# Patient Record
Sex: Female | Born: 1935
Health system: Southern US, Community
[De-identification: ages and names within clinical notes are randomized; demographics above are authoritative.]

## PROBLEM LIST (undated history)

## (undated) DIAGNOSIS — K5730 Diverticulosis of large intestine without perforation or abscess without bleeding: Secondary | ICD-10-CM

## (undated) DIAGNOSIS — K269 Duodenal ulcer, unspecified as acute or chronic, without hemorrhage or perforation: Secondary | ICD-10-CM

## (undated) DIAGNOSIS — G459 Transient cerebral ischemic attack, unspecified: Secondary | ICD-10-CM

## (undated) DIAGNOSIS — E785 Hyperlipidemia, unspecified: Secondary | ICD-10-CM

## (undated) DIAGNOSIS — D126 Benign neoplasm of colon, unspecified: Secondary | ICD-10-CM

## (undated) DIAGNOSIS — K2950 Unspecified chronic gastritis without bleeding: Secondary | ICD-10-CM

## (undated) DIAGNOSIS — K859 Acute pancreatitis without necrosis or infection, unspecified: Secondary | ICD-10-CM

## (undated) DIAGNOSIS — K2970 Gastritis, unspecified, without bleeding: Secondary | ICD-10-CM

## (undated) DIAGNOSIS — K222 Esophageal obstruction: Secondary | ICD-10-CM

## (undated) DIAGNOSIS — I10 Essential (primary) hypertension: Secondary | ICD-10-CM

## (undated) DIAGNOSIS — K2270 Barrett's esophagus without dysplasia: Secondary | ICD-10-CM

## (undated) DIAGNOSIS — I341 Nonrheumatic mitral (valve) prolapse: Secondary | ICD-10-CM

## (undated) DIAGNOSIS — K635 Polyp of colon: Secondary | ICD-10-CM

## (undated) DIAGNOSIS — M1990 Unspecified osteoarthritis, unspecified site: Secondary | ICD-10-CM

## (undated) DIAGNOSIS — K219 Gastro-esophageal reflux disease without esophagitis: Secondary | ICD-10-CM

## (undated) DIAGNOSIS — IMO0002 Reserved for concepts with insufficient information to code with codable children: Secondary | ICD-10-CM

## (undated) HISTORY — DX: Duodenal ulcer, unspecified as acute or chronic, without hemorrhage or perforation: K26.9

## (undated) HISTORY — PX: BREAST SURGERY: SHX581

## (undated) HISTORY — PX: NECK SURGERY: SHX720

## (undated) HISTORY — DX: Unspecified osteoarthritis, unspecified site: M19.90

## (undated) HISTORY — PX: BACK SURGERY: SHX140

## (undated) HISTORY — DX: Acute pancreatitis without necrosis or infection, unspecified: K85.90

## (undated) HISTORY — DX: Barrett's esophagus without dysplasia: K22.70

## (undated) HISTORY — DX: Gastritis, unspecified, without bleeding: K29.70

## (undated) HISTORY — DX: Hyperlipidemia, unspecified: E78.5

## (undated) HISTORY — PX: KNEE ARTHROSCOPY: SUR90

## (undated) HISTORY — PX: CATARACT EXTRACTION: SUR2

## (undated) HISTORY — DX: Diverticulosis of large intestine without perforation or abscess without bleeding: K57.30

## (undated) HISTORY — PX: ABDOMINAL HYSTERECTOMY: SUR658

## (undated) HISTORY — PX: EYE SURGERY: SHX253

## (undated) HISTORY — PX: CHOLECYSTECTOMY: SHX55

## (undated) HISTORY — DX: Polyp of colon: K63.5

## (undated) HISTORY — DX: Transient cerebral ischemic attack, unspecified: G45.9

## (undated) HISTORY — DX: Nonrheumatic mitral (valve) prolapse: I34.1

## (undated) HISTORY — DX: Benign neoplasm of colon, unspecified: D12.6

## (undated) HISTORY — DX: Esophageal obstruction: K22.2

## (undated) HISTORY — PX: TONSILLECTOMY: SUR1361

## (undated) HISTORY — DX: Essential (primary) hypertension: I10

## (undated) HISTORY — DX: Reserved for concepts with insufficient information to code with codable children: IMO0002

## (undated) HISTORY — DX: Unspecified chronic gastritis without bleeding: K29.50

## (undated) HISTORY — DX: Gastro-esophageal reflux disease without esophagitis: K21.9

## (undated) HISTORY — PX: APPENDECTOMY: SHX54

## (undated) HISTORY — PX: FOOT SURGERY: SHX648

---

## 1984-06-25 ENCOUNTER — Encounter (INDEPENDENT_AMBULATORY_CARE_PROVIDER_SITE_OTHER): Payer: Self-pay | Admitting: *Deleted

## 1989-02-26 ENCOUNTER — Encounter (INDEPENDENT_AMBULATORY_CARE_PROVIDER_SITE_OTHER): Payer: Self-pay | Admitting: *Deleted

## 1998-05-03 ENCOUNTER — Encounter: Payer: Self-pay | Admitting: Internal Medicine

## 1998-06-05 ENCOUNTER — Encounter: Payer: Self-pay | Admitting: Internal Medicine

## 2003-05-04 ENCOUNTER — Encounter: Payer: Self-pay | Admitting: Internal Medicine

## 2003-05-18 ENCOUNTER — Ambulatory Visit (HOSPITAL_COMMUNITY): Admission: RE | Admit: 2003-05-18 | Discharge: 2003-05-18 | Payer: Self-pay | Admitting: Internal Medicine

## 2003-05-18 ENCOUNTER — Encounter (INDEPENDENT_AMBULATORY_CARE_PROVIDER_SITE_OTHER): Payer: Self-pay | Admitting: *Deleted

## 2006-11-22 ENCOUNTER — Encounter: Payer: Self-pay | Admitting: Internal Medicine

## 2009-06-05 ENCOUNTER — Encounter (INDEPENDENT_AMBULATORY_CARE_PROVIDER_SITE_OTHER): Payer: Self-pay | Admitting: *Deleted

## 2009-07-15 ENCOUNTER — Ambulatory Visit: Payer: Self-pay | Admitting: Internal Medicine

## 2009-07-15 DIAGNOSIS — K5900 Constipation, unspecified: Secondary | ICD-10-CM

## 2009-07-15 DIAGNOSIS — R1013 Epigastric pain: Secondary | ICD-10-CM

## 2009-07-15 DIAGNOSIS — K219 Gastro-esophageal reflux disease without esophagitis: Secondary | ICD-10-CM | POA: Insufficient documentation

## 2009-07-15 DIAGNOSIS — R1011 Right upper quadrant pain: Secondary | ICD-10-CM

## 2009-07-15 HISTORY — DX: Constipation, unspecified: K59.00

## 2009-07-15 HISTORY — DX: Right upper quadrant pain: R10.11

## 2009-07-16 ENCOUNTER — Ambulatory Visit: Payer: Self-pay | Admitting: Internal Medicine

## 2009-07-16 LAB — CONVERTED CEMR LAB: UREASE: NEGATIVE

## 2009-07-18 ENCOUNTER — Ambulatory Visit (HOSPITAL_COMMUNITY): Admission: RE | Admit: 2009-07-18 | Discharge: 2009-07-18 | Payer: Self-pay | Admitting: Internal Medicine

## 2009-08-30 ENCOUNTER — Telehealth (INDEPENDENT_AMBULATORY_CARE_PROVIDER_SITE_OTHER): Payer: Self-pay | Admitting: *Deleted

## 2010-07-29 NOTE — Letter (Signed)
Summary: EGD Instructions  [facility]  [address] [place], [place] [address]   Phone: [phone]  Fax: [phone]       Pamela Hogan    01/21/1935    MRN: [id number]       Procedure Day /Date:TUESDAY, 07/16/09     Arrival Time: 1:30 PM     Procedure Time:2:30 PM     Location of Procedure:                    [facility] (4th Floor)    PREPARATION FOR ENDOSCOPY   OnTUESDAYTHE DAY OF THE PROCEDURE:  1.   No solid foods, milk or milk products are allowed after midnight the night before your procedure.  2.   Do not drink anything colored red or purple.  Avoid juices with pulp.  No orange juice.  3.  You may drink clear liquids until 12:30 PM, which is 2 hours before your procedure.                                                                                                CLEAR LIQUIDS INCLUDE: Water Jello Ice Popsicles Tea (sugar ok, no milk/cream) Powdered fruit flavored drinks Coffee (sugar ok, no milk/cream) Gatorade Juice: apple, white grape, white cranberry  Lemonade Clear bullion, consomm, broth Carbonated beverages (any kind) Strained chicken noodle soup Hard Candy   MEDICATION INSTRUCTIONS  Unless otherwise instructed, you should take regular prescription medications with a small sip of water as early as possible the morning of your procedure.             OTHER INSTRUCTIONS  You will need a responsible adult at least 74 years of age to accompany you and drive you home.   This person must remain in the waiting room during your procedure.  Wear loose fitting clothing that is easily removed.  Leave jewelry and other valuables at home.  However, you may wish to bring a book to read or an iPod/MP3 player to listen to music as you wait for your procedure to start.  Remove all body piercing jewelry and leave at home.  Total time from sign-in until discharge is approximately 2-3 hours.  You should go home  directly after your procedure and rest.  You can resume normal activities the day after your procedure.  The day of your procedure you should not:   Drive   Make legal decisions   Operate machinery   Drink alcohol   Return to work  You will receive specific instructions about eating, activities and medications before you leave.    The above instructions have been reviewed and explained to me by   _______________________    I fully understand and can verbalize these instructions _____________________________ Date _________

## 2010-07-29 NOTE — Procedures (Signed)
Summary: Colonoscopy w/ Bx/[facility]  Colonoscopy w/ Bx/[facility]   Imported By: Sherian Rein 07/17/2009 07:56:58  _____________________________________________________________________  External Attachment:    Type:   Image     Comment:   External Document

## 2010-07-29 NOTE — Procedures (Signed)
Summary: Upper Endoscopy  Patient: Pamela Hogan Note: All result statuses are Final unless otherwise noted.  Tests: (1) Upper Endoscopy (EGD)   EGD Upper Endoscopy       DONE     [facility]     [address]     [place], [place]  [address]           ENDOSCOPY PROCEDURE REPORT           PATIENT:  Pamela, Hogan  MR#:  [id number]     BIRTHDATE:  1934-08-15, 74 yrs. old  GENDER:  female           ENDOSCOPIST:  Wilhemina Bonito. Eda Keys, MD     Referred by:  Office           PROCEDURE DATE:  07/16/2009     PROCEDURE:  EGD with biopsy     ASA CLASS:  Class II     INDICATIONS:  abdominal pain, right upper quad           MEDICATIONS:   Fentanyl 50 mcg IV, Versed 5 mg IV     TOPICAL ANESTHETIC:  Exactacain Spray           DESCRIPTION OF PROCEDURE:   After the risks benefits and     alternatives of the procedure were thoroughly explained, informed     consent was obtained.  The LB GIF-H180 [id number] endoscope was     introduced through the mouth and advanced to the second portion of     the duodenum, without limitations.  The instrument was slowly     withdrawn as the mucosa was fully examined.     <<PROCEDUREIMAGES>>           A benign ring-like stricture was found in the distal esophagus.  A     3cm hiatal hernia was present.  Multiple erosions were found in     the antrum. CLO bx taken. Otherwise the examination was normal to     D2.    Retroflexed views revealed the hiatal hernia.    The scope     was then withdrawn from the patient and the procedure completed.           COMPLICATIONS:  None           ENDOSCOPIC IMPRESSION:     1) Stricture in the distal esophagus (asymptomatic)     2) Hiatal hernia     3) Erosions, multiple in the antrum - s/p CLO biopsy     4) Otherwise normal examination     5) GERD           RECOMMENDATIONS:     1) continue Nexium     2) await biopsy results     3) Keep Abdominal Ultrasound appointment as planned        ______________________________     Wilhemina Bonito. Eda Keys, MD           CC:  Nadine Counts, MD, The Patient           n.     eSIGNED:   Wilhemina Bonito. Eda Keys at 07/16/2009 03:02 PM           Marya Fossa, [id number]  Note: An exclamation mark (!) indicates a result that was not dispersed into the flowsheet. Document Creation Date: 07/16/2009 3:02 PM _______________________________________________________________________  (1) Order result status: Final Collection or observation date-time: 07/16/2009 14:55 Requested date-time:  Receipt date-time:  Reported date-time:  Referring Physician:   Ordering Physician: Fransico Setters [phone]) Specimen Source:  Source: Launa Grill Order Number: [id number] Lab site:

## 2010-07-29 NOTE — Procedures (Signed)
Summary: EGD   EGD  Procedure date:  05/04/2003  Findings:      Location: [facility]  Findings: Stricture:  GERD  EGD  Procedure date:  05/04/2003  Findings:      Location: [facility]  Findings: Stricture:  GERD Patient Name: Pamela Hogan, Pamela Hogan. MRN:  Procedure Procedures: Panendoscopy (EGD) CPT: 43235.  Personnel: Endoscopist: Wilhemina Bonito. Marina Goodell, MD.  Exam Location: Exam performed in Outpatient Clinic. Outpatient  Patient Consent: Procedure, Alternatives, Risks and Benefits discussed, consent obtained, from patient. Consent was obtained by the RN.  Indications Symptoms: Abdominal pain, location: RUQ.  History  Current Medications: Patient is not currently taking Coumadin.  Pre-Exam Physical: Performed May 04, 2003  Entire physical exam was normal.  Exam Exam Info: Maximum depth of insertion Duodenum, intended Duodenum. Patient position: on left side. Vocal cords visualized. Gastric retroflexion performed. Images taken. ASA Classification: II. Tolerance: excellent.  Sedation Meds: Patient assessed and found to be appropriate for moderate (conscious) sedation. Residual sedation present from prior procedure today.  Monitoring: BP and pulse monitoring done. Oximetry used. Supplemental O2 given  Findings STRICTURE / STENOSIS: Stricture in Distal Esophagus.  Constriction: partial. Etiology: benign due to reflux. 31 cm from mouth. Lumen diameter is 15 mm. ICD9: Esophageal Stricture: 530.3.  HIATAL HERNIA:    Comments: Otherwise normal Assessment Abnormal examination, see findings above.  Diagnoses: 530.3: Esophageal Stricture.  530.81: GERD.   Events  Unplanned Intervention: No unplanned interventions were required.  Unplanned Events: There were no complications. Plans Medication(s): Continue current medications.  Disposition: After procedure patient sent to recovery. After recovery patient sent home.  Scheduling: Office  Visit, to [employer]. Marina Goodell, MD, in 4-6 weeks    This report was created from the original endoscopy report, which was reviewed and signed by the above listed endoscopist.   cc:  Nadine Counts, MD      The Patient

## 2010-07-29 NOTE — Progress Notes (Signed)
  Phone Note Call from Patient Call back at Home Phone [phone]   Caller: Mom Reason for Call: Acute Illness Details for Reason: COUGH Initial call taken by: Lannette Donath,  August 30, 2009 12:45 PM  Follow-up for Phone Call        needs Appt Scheduled Today Follow-up by: Lannette Donath,  August 30, 2009 12:49 PM  Additional Follow-up for Phone Call Additional follow up Details #1::        Appt Scheduled Today Additional Follow-up by: Lannette Donath,  August 30, 2009 12:50 PM

## 2010-07-29 NOTE — Procedures (Signed)
Summary: [facility]   Imported By: Sherian Rein 07/17/2009 10:05:55  _____________________________________________________________________  External Attachment:    Type:   Image     Comment:   External Document

## 2010-07-29 NOTE — Miscellaneous (Signed)
Summary: [facility]  Clinical Lists Changes  Orders: Added new Test order of TLB-H Pylori Screen Gastric Biopsy (83013-CLOTEST) - Signed 

## 2010-07-29 NOTE — Procedures (Signed)
Summary: Colonoscopy   Colonoscopy  Procedure date:  05/04/2003  Findings:      Location:  [facility].  Results: Polyp.  Small Hamartomattous   Patient Name: Pamela Hogan, Pamela Hogan. MRN:  Procedure Procedures: Colonoscopy CPT: [phone].    with polypectomy. CPT: [id number].  Personnel: Endoscopist: Wilhemina Bonito. Marina Goodell, MD.  Exam Location: Exam performed in Outpatient Clinic. Outpatient  Patient Consent: Procedure, Alternatives, Risks and Benefits discussed, consent obtained, from patient. Consent was obtained by the RN.  Indications  Surveillance of: Adenomatous Polyp(s). This is not an initial surveillance exam. Initial polypectomy was performed in 1985. Previous surveillance exam(s) in  1999,  History  Current Medications: Patient is not currently taking Coumadin.  Pre-Exam Physical: Performed May 04, 2003. Entire physical exam was normal.  Exam Exam: Extent of exam reached: Transverse Colon, extent intended: Cecum.  Exam incomplete due to fixed tortuous colon. Patient position: from side to side. Colon retroflexion performed. Images taken. ASA Classification: II. Tolerance: excellent.  Monitoring: Pulse and BP monitoring, Oximetry used. Supplemental O2 given.  Colon Prep Used Golytely for colon prep. Prep results: excellent.  Sedation Meds: Patient assessed and found to be appropriate for moderate (conscious) sedation. Fentanyl 100 mcg. given IV. Versed 8 mg. given IV.  Findings DIVERTICULOSIS: Sigmoid Colon. ICD9: Diverticulosis, Colon: 562.10.  POLYP: Sigmoid Colon, diminutive, sessile polyp. Procedure:  snare without cautery, removed, retrieved, Polyp sent to pathology. ICD9: Colon Polyps: 211.3.    Comments: Could not advance either adult or pediatric colonoscope beyond distal transverse colon Assessment Abnormal examination, see findings above.  Diagnoses: 562.10: Diverticulosis, Colon.  211.3: Colon Polyps.   Events  Unplanned Interventions: No  intervention was required.  Unplanned Events: There were no complications. Plans Disposition: After procedure patient sent to recovery. After recovery patient sent home.  Comments: ACBE "clear proximal colon"  This report was created from the original endoscopy report, which was reviewed and signed by the above listed endoscopist.   cc:  Nadine Counts, MD      The Patient

## 2010-07-29 NOTE — Procedures (Signed)
Summary: Colonoscopy/[facility]  Colonoscopy/[facility]   Imported By: Sherian Rein 07/17/2009 10:01:55  _____________________________________________________________________  External Attachment:    Type:   Image     Comment:   External Document

## 2010-07-29 NOTE — Procedures (Signed)
Summary: EGD/[facility]  EGD/[facility]   Imported By: Sherian Rein 07/17/2009 10:06:56  _____________________________________________________________________  External Attachment:    Type:   Image     Comment:   External Document

## 2010-07-29 NOTE — Assessment & Plan Note (Signed)
Summary: ABDOMINAL PAIN/CHANGE IN BOWEL/SP   History of Present Illness Visit Type: Initial Visit Primary GI MD: Yancey Flemings MD Primary Provider: Nadine Counts, MD Chief Complaint: Epigastric pain, constipation alternating with diarrhea at times, not constant History of Present Illness:   76 year old white female with a history of hypertension, hyperlipidemia, transient ischemic attacks, arthritis, GERD, peptic stricture, and adenomatous colon polyps. She presents today for evaluation of right upper quadrant and epigastric pain. She states that she has had this for many years. Her last such attack was about one month ago when she made the appointment. She states that the discomfort feels like an aching. The discomfort he said the last for days or even a week. Occasionally exacerbated by meals, though not always. She does feel constipated with pain and will notice some improvement after taking a laxative and had a bowel movement. Some nausea but no vomiting. For her reflux disease she is on Nexium. No problems with breakthrough pyrosis or dysphagia. She has undergone multiple colonoscopies in the past. These have been difficult and/or incomplete. Colonoscopies were performed in 1985, 1999, 2004, and most recently at [facility] with Dr. Chales Abrahams May 2008. I have reviewed the report. The examination was difficult but complete to the cecum. No polyps. Random biopsies were unremarkable (reviewed). Bleeding was determined to be secondary to internal hemorrhoids.. Her chronic medical problems are stable.   GI Review of Systems    Reports abdominal pain, acid reflux, belching, and  nausea.     Location of  Abdominal pain: rectal quadrant and epigastric.    Denies bloating, chest pain, dysphagia with liquids, dysphagia with solids, heartburn, loss of appetite, vomiting, vomiting blood, weight loss, and  weight gain.      Reports constipation, hemorrhoids, and  irritable bowel syndrome.     Denies anal  fissure, black tarry stools, change in bowel habit, diarrhea, diverticulosis, fecal incontinence, heme positive stool, jaundice, light color stool, liver problems, rectal bleeding, and  rectal pain. Preventive Screening-Counseling & Management  Alcohol-Tobacco     Smoking Status: never      Drug Use:  no.      Current Medications (verified): 1)  Aspirin 325 Mg Tabs (Aspirin) .Marland Kitchen.. 1 Tablet By Mouth Once Daily 2)  Nexium 40 Mg Cpdr (Esomeprazole Magnesium) .Marland Kitchen.. 1 Capsule By Mouth Once Daily 3)  Toprol Xl 100 Mg Xr24h-Tab (Metoprolol Succinate) .Marland Kitchen.. 1 Tablet By Mouth Once Daily 4)  Celexa 40 Mg Tabs (Citalopram Hydrobromide) .... Take As Directed 5)  Alprazolam 0.25 Mg Tabs (Alprazolam) .Marland Kitchen.. 1 Tablet By Mouth Once Daily 6)  Lisinopril-Hydrochlorothiazide 20-25 Mg Tabs (Lisinopril-Hydrochlorothiazide) .... Once Daily 7)  Multivitamins  Tabs (Multiple Vitamin) .... Once Daily 8)  Mobic 15 Mg Tabs (Meloxicam) .... Once Daily 9)  Cyclobenzaprine Hcl 10 Mg Tabs (Cyclobenzaprine Hcl) .... Once Daily 10)  Flonase 50 Mcg/act Susp (Fluticasone Propionate) .... As Needed 11)  Gabapentin 300 Mg Caps (Gabapentin) .... Three Times A Day 12)  Dicyclomine Hcl 10 Mg Caps (Dicyclomine Hcl) .... Two Times A Day As Needed 13)  Tramadol Hcl 50 Mg Tabs (Tramadol Hcl) .... As Needed  Allergies (verified): No Known Drug Allergies  Past History:  Past Medical History: Reviewed history from 07/11/2009 and no changes required. GERD HX of Colon Polyps Diverticulosis Peptic Stricture Hemorrhoids Hypertension Arthritis  Past Surgical History: Hysterectomy Breast Surgery Appendectomy Knee Arthroscopy  Family History: Family History of Breast Cancer: Family History of Colon Cancer: Family History of Heart Disease: Brother Family History of Irritable  Bowel Syndrome:Mother  Social History: Occupation: Retired Patient has never smoked.  Alcohol Use - no Illicit Drug Use - no Smoking Status:   never Drug Use:  no  Review of Systems       sinus an allergy trouble, arthritis, back pain, fatigue, occasional fever, headaches, muscle pains and cramps, night sweats occasionally. All other systems reviewed and reported as negative.  Vital Signs:  Patient profile:   76 year old female Height:      65 inches Weight:      149 pounds BMI:     24.88 Pulse rate:   60 / minute Pulse rhythm:   regular BP sitting:   158 / 82  (left arm) Cuff size:   regular  Vitals Entered By: June McMurray CMA Duncan Dull) (July 15, 2009 10:18 AM)  Physical Exam  General:  Well developed, well nourished, no acute distress. Head:  Normocephalic and atraumatic. Eyes:  PERRLA, no icterus. Ears:  Normal auditory acuity. Nose:  No deformity, discharge,  or lesions. Mouth:  No deformity or lesions, Neck:  Supple; no masses or thyromegaly. Lungs:  Clear throughout to auscultation. Heart:  Regular rate and rhythm; no murmurs, rubs,  or bruits. Abdomen:  Soft, nontender and nondistended. No masses, hepatosplenomegaly or hernias noted. Normal bowel sounds. Msk:  Symmetrical with no gross deformities. Normal posture. Pulses:  Normal pulses noted. Extremities:  No clubbing, cyanosis, edema or deformities noted. Neurologic:  Alert and  oriented x4;  grossly normal neurologically. Skin:  Intact without significant lesions or rashes. Cervical Nodes:  No significant cervical adenopathy.no supraclavicular adenopathy Psych:  Alert and cooperative. Normal mood and affect.   Impression & Recommendations:  Problem # 1:  ABDOMINAL PAIN RIGHT UPPER QUADRANT (ICD-789.01) Itermittent chronic right upper quadrant and epigastric (789.06) pain. This seems to be related to constipation. She has not had normal imaging or upper endoscopy in some time. She is concerned that the problem is worse or different. I am not certain that this is the case. No bleeding or weight loss.  Plan: #1. Diagnostic upper endoscopy. The nature  of the procedure as well as risks, benefits, and alternatives have been reviewed. She understood and agreed to  proceed. #2. Abdominal ultrasound  Problem # 2:  CONSTIPATION (ICD-564.00) constipation predominant irritable bowel syndrome.I feel that this could explain her abdominal complaints. She seems to get relief of her discomfort, somewhat, after defecating. As such, MiraLax for laxative tablets p.r.n. recommended.  Problem # 3:  GERD (ICD-530.81) classic GERD symptoms controlled with Nexium. No recurrent dysphagia  Plan: #1. Continue Nexium #2. Reflux precautions #3. Esophageal dilation p.r.n. recurrent dysphagia  Other Orders: Ultrasound Abdomen (UAS) EGD (EGD)  Patient Instructions: 1)  EGD [facility] 07/16/09 2:30 pm 2)  Abdominal Ultrasound [facility] 07/16/09 11:00 am 3)  Upper Endoscopy brochure given.  4)  The medication list was reviewed and reconciled.  All changed / newly prescribed medications were explained.  A complete medication list was provided to the patient / caregiver.

## 2010-08-01 NOTE — Procedures (Signed)
Summary: Upper GI Endoscopy/[facility]  Upper GI Endoscopy/[facility]   Imported By: Sherian Rein 07/17/2009 10:04:21  _____________________________________________________________________  External Attachment:    Type:   Image     Comment:   External Document

## 2010-12-21 IMAGING — US US ABDOMEN COMPLETE
1 series · 5 of 5 positions shown · non-contrast
Comparison: None.

CLINICAL DATA: Right upper abdominal pain.  Previous hysterectomy.
Hypertension.

COMPLETE ABDOMINAL ULTRASOUND

[Series 1: us abdomen complete · 0.21mm/px · 5 of 5 slices shown]
[im 1/5]
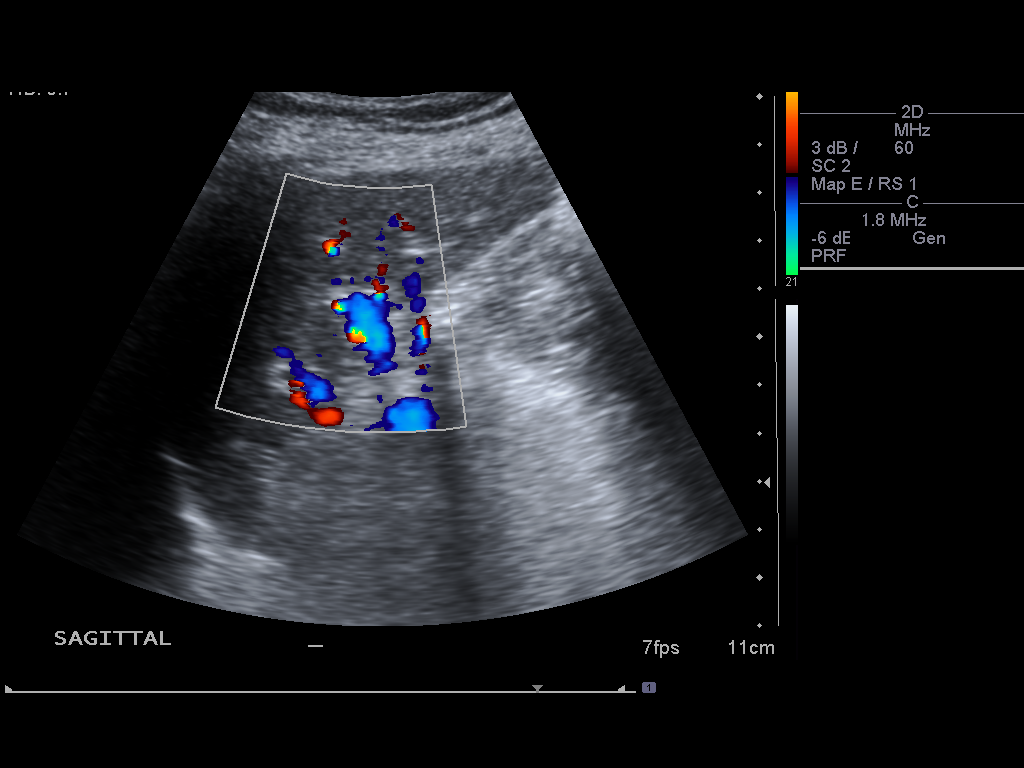
[im 2/5]
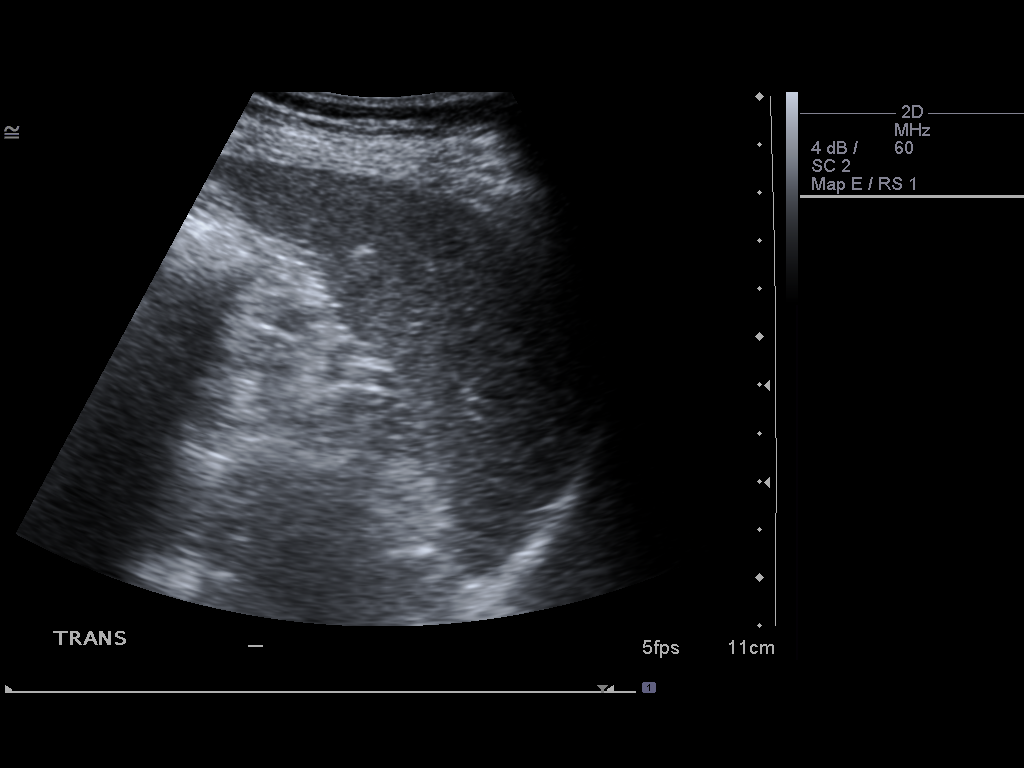
[im 3/5]
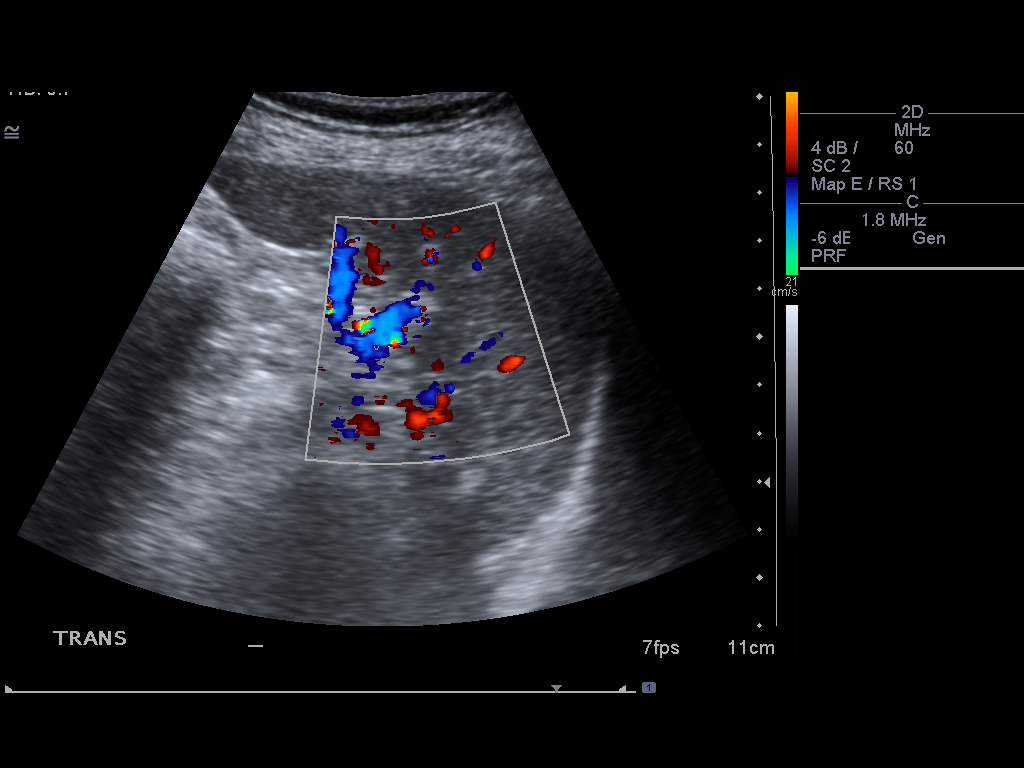
[im 4/5]
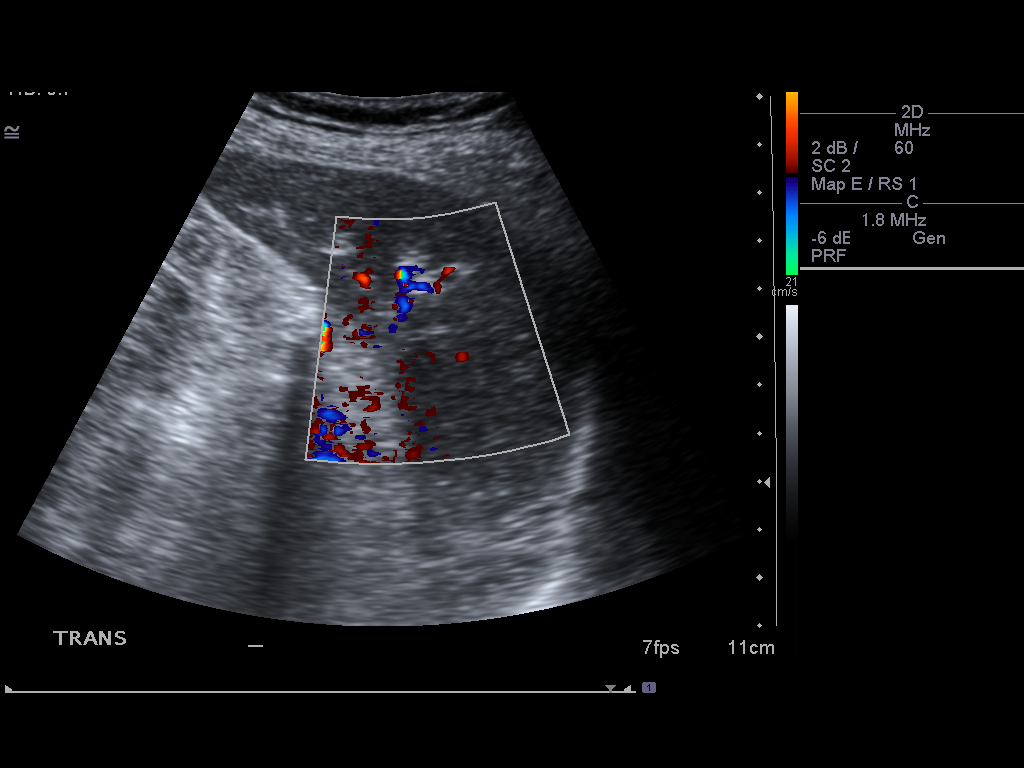
[im 5/5]
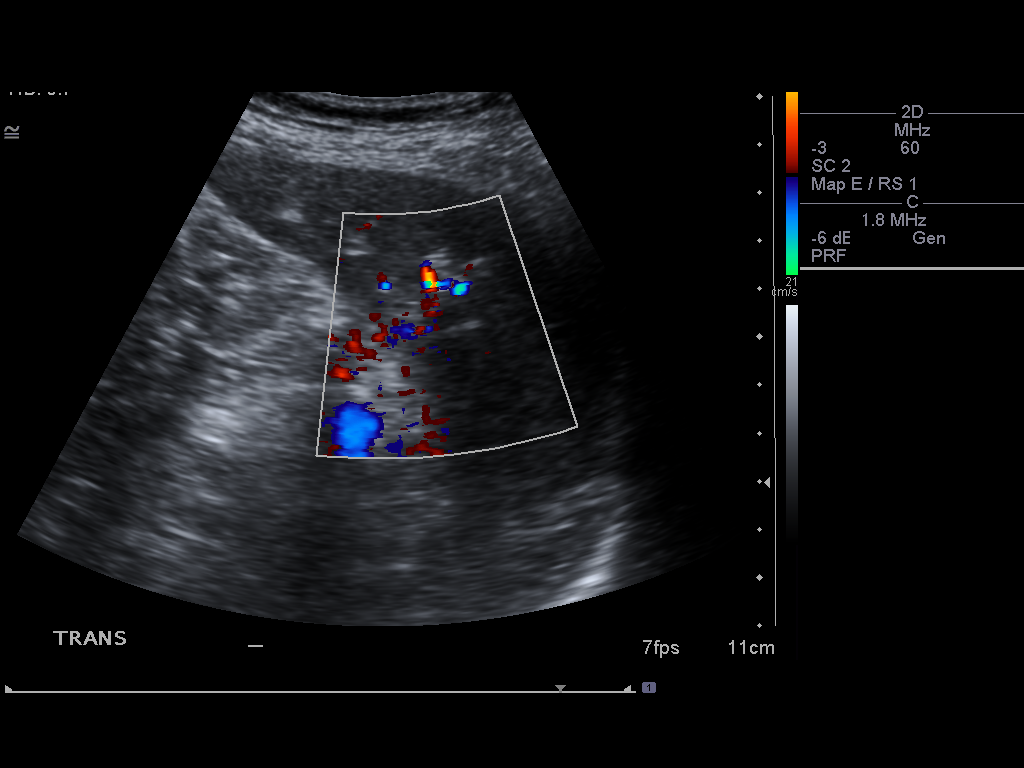

[5 of 5 positions shown; findings below may reference images not displayed]

FINDINGS: Gallbladder:  No gallstones, gallbladder wall thickening, or
pericholecystic fluid. Sonographer reports no sonographic Murphy's
sign.

Common bile duct:  3.1 mm diameter, unremarkable

Liver:  No focal lesion nor intrahepatic bile duct dilatation.
Normal caliber.

IVC:  Appears normal.

Pancreas:  Largely obscured by overlying bowel gas.

Spleen:  Unremarkable, 7.9 cm in length.

Right Kidney:  10.9 cm in length, no focal lesion or hydronephrosis

Left Kidney:  12 cm in length, without focal lesion or
hydronephrosis

Abdominal aorta:  No aneurysm identified.
IMPRESSION: 1.  No acute abnormality.
2.  Limited pancreatic visualization.

## 2011-02-12 ENCOUNTER — Telehealth: Payer: Self-pay | Admitting: Internal Medicine

## 2011-02-12 NOTE — Telephone Encounter (Signed)
Pt states that her stomach has been hurting and she has had some nausea, she has not actually thrown up. Pt reports that her stool has been dark black. Pt is requesting an appt. Pt scheduled to see Amy Esterwood PA 02/13/11@11am . Pt aware of appt date and time.

## 2011-02-13 ENCOUNTER — Other Ambulatory Visit (INDEPENDENT_AMBULATORY_CARE_PROVIDER_SITE_OTHER): Payer: MEDICARE

## 2011-02-13 ENCOUNTER — Ambulatory Visit (INDEPENDENT_AMBULATORY_CARE_PROVIDER_SITE_OTHER): Payer: MEDICARE | Admitting: Physician Assistant

## 2011-02-13 ENCOUNTER — Encounter: Payer: Self-pay | Admitting: Physician Assistant

## 2011-02-13 VITALS — BP 118/72 | HR 72 | Ht 74.0 in | Wt 154.0 lb

## 2011-02-13 DIAGNOSIS — K921 Melena: Secondary | ICD-10-CM

## 2011-02-13 DIAGNOSIS — R1013 Epigastric pain: Secondary | ICD-10-CM

## 2011-02-13 DIAGNOSIS — Z86010 Personal history of colonic polyps: Secondary | ICD-10-CM | POA: Insufficient documentation

## 2011-02-13 DIAGNOSIS — K5730 Diverticulosis of large intestine without perforation or abscess without bleeding: Secondary | ICD-10-CM

## 2011-02-13 DIAGNOSIS — Z8673 Personal history of transient ischemic attack (TIA), and cerebral infarction without residual deficits: Secondary | ICD-10-CM

## 2011-02-13 DIAGNOSIS — K5790 Diverticulosis of intestine, part unspecified, without perforation or abscess without bleeding: Secondary | ICD-10-CM

## 2011-02-13 HISTORY — DX: Personal history of transient ischemic attack (TIA), and cerebral infarction without residual deficits: Z86.73

## 2011-02-13 LAB — CBC WITH DIFFERENTIAL/PLATELET
Basophils Absolute: 0.1 10*3/uL (ref 0.0–0.1)
Basophils Relative: 1 % (ref 0.0–3.0)
Eosinophils Absolute: 0.2 10*3/uL (ref 0.0–0.7)
Eosinophils Relative: 3.4 % (ref 0.0–5.0)
HCT: 36.2 % (ref 36.0–46.0)
Hemoglobin: 12 g/dL (ref 12.0–15.0)
Lymphocytes Relative: 29.8 % (ref 12.0–46.0)
Lymphs Abs: 1.8 10*3/uL (ref 0.7–4.0)
MCHC: 33 g/dL (ref 30.0–36.0)
MCV: 86.1 fl (ref 78.0–100.0)
Monocytes Absolute: 0.5 10*3/uL (ref 0.1–1.0)
Monocytes Relative: 7.8 % (ref 3.0–12.0)
Neutro Abs: 3.5 10*3/uL (ref 1.4–7.7)
Neutrophils Relative %: 58 % (ref 43.0–77.0)
Platelets: 330 10*3/uL (ref 150.0–400.0)
RBC: 4.21 Mil/uL (ref 3.87–5.11)
RDW: 13.8 % (ref 11.5–14.6)
WBC: 6.1 10*3/uL (ref 4.5–10.5)

## 2011-02-13 NOTE — Progress Notes (Signed)
Subjective:    Patient ID: Pamela Hogan, female    DOB: 11/26/1934, 76 y.o.   MRN: [id number]  HPI Elley is a pleasant 76 year old white female known to Dr. Marina Goodell with history of sigmoid diverticulosis, adenomatous colon polyps, GERD and previous peptic stricture. She did undergo upper endoscopy last in January of 2011 with finding of multiple antral erosions she was closed negative. She has been on Nexium over the past couple of years. Last colonoscopy was done in 2008 per Dr. Chales Abrahams in in [place],and was a complete exam- Though she does have a tortuous and somewhat fixed colon. There were no polyps found she did have a few diverticuli in the sigmoid colon.  Leone comes in today with complaints of black stools over the past 3-4 days. She says she's been having one to 2 bowel movements per day and all of her stools have been black until early this morning when she had a bowel movement that appeared more normal. She has not noted any bright red blood She does complain of some mild nausea without vomiting, she denies any dysphagia or odynophagia and her appetite has been okay. She has not had any documented weight loss. She says she has been having some mid abdominal pain gurgling and cramping over the past week. Over the past several months she says she has had some intermittent mid abdominal pain. She denies any dizziness or shortness of breath says she was a little bit lightheaded yesterday. She does take a an aspirin 325 mg once daily for previous TIA and has been on Mobic for years for arthritis. She denies any other anti-inflammatories.    Review of Systems  Constitutional: Negative.   HENT: Negative.   Eyes: Negative.   Respiratory: Negative.   Cardiovascular: Negative.   Gastrointestinal: Positive for nausea, abdominal pain and blood in stool.  Genitourinary: Negative.   Musculoskeletal: Positive for back pain and arthralgias.  Skin: Negative.   Neurological: Positive for  light-headedness.  Hematological: Negative.   Psychiatric/Behavioral: Negative.    Outpatient Encounter Prescriptions as of 02/13/2011  Medication Sig Dispense Refill  . ALPRAZolam (XANAX) 0.25 MG tablet Take 0.25 mg by mouth at bedtime as needed.        . cetirizine (ZYRTEC) 10 MG tablet Take 10 mg by mouth as needed.        . Cholecalciferol (VITAMIN D3) 1000 UNITS CAPS Take 1 capsule by mouth daily.        . citalopram (CELEXA) 40 MG tablet Take 40 mg by mouth daily.       Marland Kitchen co-enzyme Q-10 30 MG capsule Take 30 mg by mouth daily.        Marland Kitchen dicyclomine (BENTYL) 10 MG capsule Take 10 mg by mouth as needed.        . gabapentin (NEURONTIN) 600 MG tablet Take 600 mg by mouth 3 (three) times daily.        Marland Kitchen lisinopril (PRINIVIL,ZESTRIL) 40 MG tablet Take 40 mg by mouth daily.       . meloxicam (MOBIC) 15 MG tablet Take 15 mg by mouth daily.       . metoprolol (TOPROL-XL) 50 MG 24 hr tablet Take 50 mg by mouth daily.       Marland Kitchen NEXIUM 40 MG capsule Take 40 mg by mouth. Will take one a day, some days will take two as needed      . oxyCODONE-acetaminophen (PERCOCET) 10-325 MG per tablet Take 1 tablet by mouth as needed. For  back pain       . traMADol (ULTRAM) 50 MG tablet Take 50 mg by mouth as needed.        Allergies  Allergen Reactions  . Iohexol         Objective:   Physical Exam Well-developed elderly white female in no acute distress, pleasant, alert oriented x3 HEEN;T nonicteric normocephalic EOMI PERRLA sclera anicteric, color good, neck; supple no JVD, cardiovascular ;regular rate and rhythm with S1-S2 no murmur rub or gallop, Pulmonary; clear bilaterally Abdomen; soft basically nontender no palpable mass or hepatosplenomegaly bowel sounds active Rectal; dark brown stool Hemoccult negative with scant stool in the rectal wall, extremities; no clubbing cyanosis or edema skin is benign Psych; mood and affect normal an appropriate        Assessment & Plan:  #25 76 year old white female  with complaint of melena x4 days, currently Hemoccult negative@ risk for peptic ulcer disease given aspirin and NSAID use. It is possible that she had some bleeding earlier in the week which has since resolved.  Plan Stop Mobic Continue aspirin given history of TIA Continue Nexium 40 mg once daily Check CBC today Schedule for upper endoscopy with Dr. Marina Goodell as soon as possible. Patient was advised should she have any recurrence of black stools or obvious bleeding that she should seek attention in the emergency room.  #2 Sigmoid diverticulosis  #3 History of adenomatous colon polyps the negative colonoscopy 2011 Dr. Chales Abrahams ,[place] Plan followup in 2016, remembering patient has had difficult colonoscopy in the past with inability to reach the cecum due to tortuosity

## 2011-02-13 NOTE — Patient Instructions (Signed)
We will call you back with the lab results. We have given you stool cards - for 3 days. Follow the instructions and  You can either mail it back to [facility] [address] [place], [place], [place] [address].  Or bring it back to our lab basement level in our building. We are scheduling you for the Endoscopy with Dr. Marina Goodell.

## 2011-02-16 ENCOUNTER — Encounter: Payer: Self-pay | Admitting: Internal Medicine

## 2011-02-16 ENCOUNTER — Telehealth: Payer: Self-pay

## 2011-02-16 NOTE — Telephone Encounter (Signed)
Message copied by Michele Mcalpine on Mon Feb 16, 2011  9:42 AM ------      Message from: [place], [place] S      Created: Fri Feb 13, 2011  5:23 PM       Please let Elyna know her hgb is normal

## 2011-02-16 NOTE — Telephone Encounter (Signed)
Pt aware.

## 2011-02-16 NOTE — Progress Notes (Signed)
I AGREE.

## 2011-02-17 ENCOUNTER — Telehealth: Payer: Self-pay | Admitting: Internal Medicine

## 2011-02-17 NOTE — Telephone Encounter (Signed)
No

## 2011-02-19 ENCOUNTER — Other Ambulatory Visit: Payer: MEDICARE | Admitting: Internal Medicine

## 2011-03-11 ENCOUNTER — Other Ambulatory Visit: Payer: MEDICARE | Admitting: Internal Medicine

## 2013-01-18 ENCOUNTER — Telehealth: Payer: Self-pay | Admitting: Internal Medicine

## 2013-01-18 NOTE — Telephone Encounter (Signed)
Pt states she has been having abdominal pain off and on for over a year. States she had pancreatitis in the past. Saw a Dr. Charm Barges but that was over a year ago. Pt requests to be seen. 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 Pt states she has her records with her and will bring them. Pt scheduled to see Mike Gip PA tomorrow at 3pm. Pt aware of appt.

## 2013-01-19 ENCOUNTER — Ambulatory Visit (INDEPENDENT_AMBULATORY_CARE_PROVIDER_SITE_OTHER): Payer: Medicare Other | Admitting: Physician Assistant

## 2013-01-19 ENCOUNTER — Other Ambulatory Visit (INDEPENDENT_AMBULATORY_CARE_PROVIDER_SITE_OTHER): Payer: Medicare Other

## 2013-01-19 ENCOUNTER — Encounter: Payer: Self-pay | Admitting: Physician Assistant

## 2013-01-19 VITALS — BP 116/70 | HR 80 | Ht 65.0 in | Wt 151.6 lb

## 2013-01-19 DIAGNOSIS — R1012 Left upper quadrant pain: Secondary | ICD-10-CM

## 2013-01-19 DIAGNOSIS — R1011 Right upper quadrant pain: Secondary | ICD-10-CM

## 2013-01-19 DIAGNOSIS — R110 Nausea: Secondary | ICD-10-CM

## 2013-01-19 LAB — HEPATIC FUNCTION PANEL
ALT: 23 U/L (ref 0–35)
AST: 21 U/L (ref 0–37)
Albumin: 4.6 g/dL (ref 3.5–5.2)
Alkaline Phosphatase: 87 U/L (ref 39–117)
Bilirubin, Direct: 0 mg/dL (ref 0.0–0.3)
Total Bilirubin: 0.6 mg/dL (ref 0.3–1.2)
Total Protein: 7 g/dL (ref 6.0–8.3)

## 2013-01-19 LAB — LIPASE: Lipase: 56 U/L (ref 11.0–59.0)

## 2013-01-19 MED ORDER — PREDNISONE 50 MG PO TABS: ORAL_TABLET | ORAL | Status: DC

## 2013-01-19 NOTE — Patient Instructions (Addendum)
Please go to the basement level to have your labs drawn.  We sent a prescription for prednisone tablets to your pharmacy.   You will take  1- tab 50 mg at 2 :00 AM Friday 7-25.  Then 1 - tab 50 mg at 8:00 am on 01-20-2013 Friday. Than 1-tab 50 mg at 2:00 PM  on 01-20-2013 Monday. You will also take 1 tab of benedryl  25 mg at 2:00 PM  also on Friday 7-25.  We have given you a Low Fat Diet brochure.   You have been scheduled for a CT scan of the abdomen and pelvis at  [facility] ([address]---this is in the same building as [facility]).   You are scheduled on Friday 01-20-2013 for the exam.  WARNING: IF YOU ARE ALLERGIC TO IODINE/X-RAY DYE, PLEASE NOTIFY RADIOLOGY IMMEDIATELY AT [phone]! YOU WILL BE GIVEN A 13 HOUR PREMEDICATION PREP.  1) Do not eat or drink anything after 11:00 AM  (4 hours prior to your test) 2) You have been given 2 bottles of oral contrast to drink. The solution may taste better if refrigerated, but do NOT add ice or any other liquid to this solution. Shake well before drinking.    Drink 1 bottle of contrast @ 1:00 PM  (2 hours prior to your exam)  Drink 1 bottle of contrast @ 2:00 PM (1 hour prior to your exam)  You may take any medications as prescribed with a small amount of water except for the following: Metformin, Glucophage, Glucovance, Avandamet, Riomet, Fortamet, Actoplus Met, Janumet, Glumetza or Metaglip. The above medications must be held the day of the exam AND 48 hours after the exam.  The purpose of you drinking the oral contrast is to aid in the visualization of your intestinal tract. The contrast solution may cause some diarrhea. Before your exam is started, you will be given a small amount of fluid to drink. Depending on your individual set of symptoms, you may also receive an intravenous injection of x-ray contrast/dye. Plan on being at [facility] for 30 minutes or long, depending on the type of exam you are having  performed.  If you have any questions regarding your exam or if you need to reschedule, you may call the CT department at [phone] between the hours of 8:00 am and 5:00 pm, Monday-Friday.  ________________________________________________________________________

## 2013-01-19 NOTE — Progress Notes (Signed)
Subjective:    Patient ID: Pamela Hogan, female    DOB: [birth year], 78 y.o.   MRN: [id number]  HPI Pamela Hogan is a pleasant 78 year old white female known to Dr. Marina Goodell, who has history of diverticular disease and chronic GERD. She last had an endoscopy here in 2011 with finding of multiple antral erosions. She has since had an [place] June of [place] which was a normal exam. Last colonoscopy also done and aspirin per Dr. Chales Abrahams with mild pancolonic diverticulosis and a tortuous fixed colon. She did not have any polyps. Patient comes in today with complaints of recurrent episodes of upper abdominal pain. She says she's been having problems over the past couple of years but had a bad episode last summer which lasted for 5 or 6 days. She says this was associated with nausea but no vomiting. She actually had an ER visit at that time and brought labs with her that did show an elevated lipase of 378. Liver tests were normal as was a CBC. She says since that time she has had recurrent milder episodes lasting a couple of days and had an episode again in May of 2014 which lasted 2-3 days. She said she describes a "dead sick feeling 'in the pit of her stomach but sometimes radiates to her back with these episodes the she feels that she does get some low grade fever again no vomiting but generally is nauseated with poor appetite and may have some more frequent bowel movements during this time as well . She is concerned about her gallbladder as her mother who is 100 apparently has a gallbladder full of  Gallstones. Patient had an upper abnormal ultrasound here done in 2011 and that was a negative study with poor visualization of the paincreas. Patient also states that she stopped taking Pravachol after she learned that her pancreas was irritated last year and she read that that could be a cause of pancreatitis  Her last episode of pain was within the past week but she says this was a very  mild spell.    Review of Systems  Constitutional: Negative.   HENT: Negative.   Eyes: Negative.   Respiratory: Negative.   Gastrointestinal: Positive for nausea and abdominal pain.  Endocrine: Negative.   Genitourinary: Negative.   Musculoskeletal: Negative.   Skin: Negative.   Allergic/Immunologic: Negative.   Neurological: Negative.   Hematological: Negative.   Psychiatric/Behavioral: Negative.    Outpatient Prescriptions Prior to Visit  Medication Sig Dispense Refill  . ALPRAZolam (XANAX) 0.25 MG tablet Take 0.25 mg by mouth at bedtime as needed.        Marland Kitchen co-enzyme Q-10 30 MG capsule Take 30 mg by mouth daily.        Marland Kitchen dicyclomine (BENTYL) 10 MG capsule Take 10 mg by mouth as needed.        Marland Kitchen lisinopril (PRINIVIL,ZESTRIL) 40 MG tablet Take 40 mg by mouth daily.       . meloxicam (MOBIC) 15 MG tablet Take 15 mg by mouth daily.       . metoprolol (TOPROL-XL) 50 MG 24 hr tablet Take 50 mg by mouth daily.       . traMADol (ULTRAM) 50 MG tablet Take 50 mg by mouth as needed.       . cetirizine (ZYRTEC) 10 MG tablet Take 10 mg by mouth as needed.        . Cholecalciferol (VITAMIN D3) 1000 UNITS CAPS Take 1 capsule  by mouth daily.        . citalopram (CELEXA) 40 MG tablet Take 40 mg by mouth daily.       Marland Kitchen gabapentin (NEURONTIN) 600 MG tablet Take 600 mg by mouth 3 (three) times daily.        Marland Kitchen NEXIUM 40 MG capsule Take 40 mg by mouth. Will take one a day, some days will take two as needed      . oxyCODONE-acetaminophen (PERCOCET) 10-325 MG per tablet Take 1 tablet by mouth as needed. For back pain        No facility-administered medications prior to visit.   Allergies  Allergen Reactions  . Iohexol     Some type of reaction to a dye 45 years ago   Patient Active Problem List   Diagnosis Date Noted  . Hx of adenomatous colonic polyps 02/13/2011  . Diverticulosis 02/13/2011  . Hx-TIA (transient ischemic attack) 02/13/2011  . GERD 07/15/2009  . CONSTIPATION 07/15/2009  .  ABDOMINAL PAIN RIGHT UPPER QUADRANT 07/15/2009   History  Substance Use Topics  . Smoking status: Never Smoker   . Smokeless tobacco: Never Used  . Alcohol Use: No   family history includes Breast cancer in her sister; Colon cancer in her maternal aunt; Heart disease in her brother; and Irritable bowel syndrome in her mother.     Objective:   Physical Exam well-developed older white female in no acute distress, pleasant blood pressure 116/70 pulse 80 height 5 foot 5 weight 151. HEENT; nontraumatic normocephalic EOMI PERRLA sclera anicteric, Supple ;no JVD, Cardiovascular; regular rate and rhythm with S1-S2 no murmur or gallop, Pulmonary; clear bilaterally, Abdomen ;soft he is basically nontender there is no guarding or rebound no palpable mass or hepatosplenomegaly bowel sounds are active, Rectal; exam not done, Extremities; no clubbing cyanosis or edema skin warm and dry, Psych; mood and affect normal and appropriate        Assessment & Plan:  #56 78 year old female with intermittent episodes of upper abdominal pain lasting anywhere from 2-5 days and associated with nausea, no vomiting question low-grade fever and decreased appetite. Patient did have a workup during one of these episodes about a year ago and was found to have an elevated lipase. Prior upper or duodenal ultrasound was unremarkable She may be having mild episodes of recurrent pancreatitis, other considerations are biliary colic or biliary dyskinesia. #2 Pan diverticular disease #3 chronic GERD #4 hypertension  Plan; will check lipase and hepatic panel today Schedule for CT scan of the abdomen and pelvis with contrast-patient had a very remote dye reaction of some sort and therefore we will premedicate If CT is completely negative we'll proceed with CCK HIDA scan We discussed low-fat diet and avoidance of alcohol She will continue Zantac 300 mg by mouth daily for reflux

## 2013-01-20 ENCOUNTER — Other Ambulatory Visit (INDEPENDENT_AMBULATORY_CARE_PROVIDER_SITE_OTHER): Payer: Medicare Other

## 2013-01-20 ENCOUNTER — Other Ambulatory Visit: Payer: Self-pay | Admitting: *Deleted

## 2013-01-20 ENCOUNTER — Ambulatory Visit (INDEPENDENT_AMBULATORY_CARE_PROVIDER_SITE_OTHER)
Admission: RE | Admit: 2013-01-20 | Discharge: 2013-01-20 | Disposition: A | Discharge status: Home / Self Care | Payer: Medicare Other | Source: Ambulatory Visit | Attending: Physician Assistant | Admitting: Physician Assistant

## 2013-01-20 DIAGNOSIS — R1012 Left upper quadrant pain: Secondary | ICD-10-CM

## 2013-01-20 DIAGNOSIS — R1011 Right upper quadrant pain: Secondary | ICD-10-CM

## 2013-01-20 DIAGNOSIS — R110 Nausea: Secondary | ICD-10-CM

## 2013-01-20 DIAGNOSIS — R1013 Epigastric pain: Secondary | ICD-10-CM

## 2013-01-20 DIAGNOSIS — R109 Unspecified abdominal pain: Secondary | ICD-10-CM

## 2013-01-20 LAB — BASIC METABOLIC PANEL
BUN: 14 mg/dL (ref 6–23)
CO2: 26 mEq/L (ref 19–32)
Calcium: 10.2 mg/dL (ref 8.4–10.5)
Chloride: 104 mEq/L (ref 96–112)
Creatinine, Ser: 0.8 mg/dL (ref 0.4–1.2)
GFR: 74.06 mL/min (ref >60.00)
Glucose, Bld: 94 mg/dL (ref 70–99)
Potassium: 4.4 mEq/L (ref 3.5–5.1)
Sodium: 139 mEq/L (ref 135–145)

## 2013-01-20 MED ORDER — IOHEXOL 300 MG/ML  SOLN
100.0000 mL | Freq: Once | INTRAMUSCULAR | Status: AC | PRN
  Administered 2013-01-20: 100 mL via INTRAVENOUS

## 2013-01-23 ENCOUNTER — Other Ambulatory Visit: Payer: Medicare Other

## 2013-01-23 NOTE — Progress Notes (Signed)
Agree with initial assessment and plans 

## 2013-02-03 ENCOUNTER — Encounter (HOSPITAL_COMMUNITY): Payer: Self-pay

## 2013-02-03 ENCOUNTER — Other Ambulatory Visit (HOSPITAL_COMMUNITY): Payer: Medicare Other

## 2013-02-03 ENCOUNTER — Encounter (HOSPITAL_COMMUNITY)
Admission: RE | Admit: 2013-02-03 | Discharge: 2013-02-03 | Disposition: A | Discharge status: Home / Self Care | Payer: Medicare Other | Source: Ambulatory Visit | Attending: Physician Assistant | Admitting: Physician Assistant

## 2013-02-03 DIAGNOSIS — R109 Unspecified abdominal pain: Secondary | ICD-10-CM

## 2013-02-03 DIAGNOSIS — R1011 Right upper quadrant pain: Secondary | ICD-10-CM | POA: Insufficient documentation

## 2013-02-03 MED ORDER — TECHNETIUM TC 99M MEBROFENIN IV KIT
5.5000 | PACK | Freq: Once | INTRAVENOUS | Status: AC | PRN
  Administered 2013-02-03: 5.5 via INTRAVENOUS

## 2013-02-06 ENCOUNTER — Other Ambulatory Visit: Payer: Self-pay | Admitting: *Deleted

## 2013-02-06 DIAGNOSIS — R932 Abnormal findings on diagnostic imaging of liver and biliary tract: Secondary | ICD-10-CM

## 2014-02-21 ENCOUNTER — Ambulatory Visit (INDEPENDENT_AMBULATORY_CARE_PROVIDER_SITE_OTHER): Payer: Medicare Other | Admitting: Physician Assistant

## 2014-02-21 ENCOUNTER — Other Ambulatory Visit: Payer: Medicare Other

## 2014-02-21 ENCOUNTER — Encounter: Payer: Self-pay | Admitting: Physician Assistant

## 2014-02-21 VITALS — BP 110/64 | HR 60 | Ht 62.75 in | Wt 153.2 lb

## 2014-02-21 DIAGNOSIS — R141 Gas pain: Secondary | ICD-10-CM

## 2014-02-21 DIAGNOSIS — Z96651 Presence of right artificial knee joint: Secondary | ICD-10-CM | POA: Insufficient documentation

## 2014-02-21 DIAGNOSIS — R143 Flatulence: Secondary | ICD-10-CM

## 2014-02-21 DIAGNOSIS — R197 Diarrhea, unspecified: Secondary | ICD-10-CM

## 2014-02-21 DIAGNOSIS — R142 Eructation: Secondary | ICD-10-CM

## 2014-02-21 DIAGNOSIS — Z9049 Acquired absence of other specified parts of digestive tract: Secondary | ICD-10-CM

## 2014-02-21 DIAGNOSIS — Z9089 Acquired absence of other organs: Secondary | ICD-10-CM

## 2014-02-21 DIAGNOSIS — R140 Abdominal distension (gaseous): Secondary | ICD-10-CM

## 2014-02-21 HISTORY — DX: Presence of right artificial knee joint: Z96.651

## 2014-02-21 MED ORDER — RIFAXIMIN 550 MG PO TABS: 550.0000 mg | ORAL_TABLET | Freq: Two times a day (BID) | ORAL | Status: AC

## 2014-02-21 NOTE — Patient Instructions (Signed)
Please go to the basement level to our lab for stool test. Continue a probiotic for 1 month. We have given you a low gas diet brochure.  Continue the Bentyl , take 1 tab in the morning.  We sent a prescription to [facility] [address] [place], [place] for the Xifaxan 550 mg.

## 2014-02-21 NOTE — Progress Notes (Signed)
Subjective:    Patient ID: Pamela Hogan, female    DOB: 18-Jan-1935, 79 y.o.   MRN: [id number]  HPI Pamela Hogan is very nice 79 year old white female known to Dr. Henrene Pastor who has history of chronic GERD and diverticular disease. She has also had.erosive gastritis. She was last seen in our office about a year ago and at that time was having upper abdominal pain. She had undergone workup with CT scan which was negative and then had a CCK HIDA scan which showed 6% ejection fraction. She went onto subsequent laparoscopic cholecystectomy with Dr. Dory Larsen in [place] . She says she did well with the cholecystectomy, and did not have any problems afterwards. She comes in now stating that she's been having problems with episodes of diarrhea over the past several months but does not specifically rate relate this all way back to the time of the cholecystectomy. She says she tried a probiotic and that seemed to help a bit but she still having some sporadic episodes. She also has apparently had ongoing problems with malodorous gas and lower abdominal discomfort which she feels is trapped gas. Has not been on any recent antibiotics no new medications. Her appetite has been fine her weight has been stable. No fever or chills. No melena or hematochezia. She says a good day she may only have 1 bowel movement a day and other days may have 3 or 4 which may be anywhere from soft to loose. She is wondering when she is due for another colonoscopy. She did have a colonoscopy in 2004 with finding of a hamartomatous polyp in also has significant diverticular disease and a fixed sigmoid with inability to reach her cecum and 2004. Subsequent barium enema was negative. She then had a colonoscopy in 2008 by Dr. Lyndel Safe in [place] and this showed diverticulosis and no polyps.   Review of Systems  Constitutional: Negative.   HENT: Negative.   Eyes: Negative.   Respiratory: Negative.   Cardiovascular: Negative.   Gastrointestinal:  Positive for abdominal pain and diarrhea.  Endocrine: Negative.   Genitourinary: Negative.   Musculoskeletal: Positive for arthralgias and myalgias.  Skin: Negative.   Allergic/Immunologic: Negative.   Neurological: Negative.   Hematological: Negative.   Psychiatric/Behavioral: Negative.    Outpatient Prescriptions Prior to Visit  Medication Sig Dispense Refill  . ALPRAZolam (XANAX) 0.25 MG tablet Take 0.25 mg by mouth at bedtime as needed.        Marland Kitchen amLODipine (NORVASC) 10 MG tablet Take 10 mg by mouth daily.      Marland Kitchen aspirin 325 MG tablet Take 325 mg by mouth daily.      Marland Kitchen co-enzyme Q-10 30 MG capsule Take 30 mg by mouth daily.        . cyclobenzaprine (FLEXERIL) 10 MG tablet Take 10 mg by mouth 3 (three) times daily as needed for muscle spasms.      Marland Kitchen dicyclomine (BENTYL) 10 MG capsule Take 10 mg by mouth as needed.        . fluticasone (FLONASE) 50 MCG/ACT nasal spray Place 2 sprays into the nose daily.      Marland Kitchen lisinopril (PRINIVIL,ZESTRIL) 40 MG tablet Take 40 mg by mouth daily.       . meloxicam (MOBIC) 15 MG tablet Take 15 mg by mouth daily.       . metoprolol (TOPROL-XL) 50 MG 24 hr tablet Take 50 mg by mouth daily.       . ranitidine (ZANTAC) 300 MG tablet Take 300  mg by mouth at bedtime.      Marland Kitchen SOYA LECITHIN PO Take 1,325 mg by mouth 2 (two) times daily.      . traMADol (ULTRAM) 50 MG tablet Take 50 mg by mouth as needed.       Marland Kitchen HYDROcodone-acetaminophen (NORCO) 10-325 MG per tablet Take 1 tablet by mouth every 6 (six) hours as needed for pain.      . predniSONE (DELTASONE) 50 MG tablet Take 1 tab 50 mg at 2:00 Am Friday 7-25. Take 1 tab 50 mg at 8:00 am Friday 7-25.  Take 1 tab 50 mg at 2:00 PM on Friday 7-25.  3 tablet  0   No facility-administered medications prior to visit.   Allergies  Allergen Reactions  . Iohexol     Some type of reaction to a dye 45 years ago   Patient Active Problem List   Diagnosis Date Noted  . S/P cholecystectomy 02/21/2014  . Hx of  adenomatous colonic polyps 02/13/2011  . Diverticulosis 02/13/2011  . Hx-TIA (transient ischemic attack) 02/13/2011  . GERD 07/15/2009  . CONSTIPATION 07/15/2009  . ABDOMINAL PAIN RIGHT UPPER QUADRANT 07/15/2009   History   Social History  . Marital Status: Widowed    Spouse Name: N/A    Number of Children: N/A  . Years of Education: N/A   Occupational History  . [employer]    Social History Main Topics  . Smoking status: Never Smoker   . Smokeless tobacco: Never Used  . Alcohol Use: No  . Drug Use: No  . Sexual Activity: Not on file   Other Topics Concern  . Not on file   Social History Narrative  . No narrative on file   family history includes Breast cancer in her sister; Colon cancer in her maternal aunt; Heart disease in her brother; Irritable bowel syndrome in her mother.     Objective:   Physical Exam  and and and and white female in no acute distress, pleasant blood pressure 110/64 pulse 60 height 5 foot 2 weight 153. HEENT; nontraumatic normocephalic EOMI PERRLA sclera anicteric, Supple; no JVD, Cardiovascular; regular rate and rhythm with S1-S2 no murmur or gallop, Pulmonary; clear bilaterally, Abdomen; soft no focal tenderness no guarding or rebound no palpable mass or hepatosplenomegaly bowel sounds are active, Rectal exam not done, Extremities; no clubbing cyanosis or edema skin warm and dry, Psych; mood and affect appropriate        Assessment & Plan:  #46  79 year old female with a several month history of episodic diarrhea abdominal bloating and gas. Etiology is not clear. This may be a bile salt induced diarrhea though she specifically cannot tell me that it started immediate post cholecystectomy. Rule out IBS with small bowel bacterial overgrowth #2 status post lap endoscopic cholecystectomy August 2014 #3 significant diverticular disease with fixed sigmoid #4 remote history of hamartomatous polyp 2004 #5 chronic GERD  Plan; GI pathogen panel Trial of  Xifaxan 550 mg by mouth twice daily x1 week for bacterial overgrowth Gas-X a.c. Low gas diet Trial of Bentyl 10 mg by mouth every morning to see if this will help with lower abdominal discomfort Continue probiotic  Return office visit in 1 month with Dr. Henrene Pastor or myself and and depending on her response at that time can decide on further workup in need for followup colonoscopy

## 2014-02-21 NOTE — Progress Notes (Signed)
Agree 

## 2014-02-22 ENCOUNTER — Telehealth: Payer: Self-pay | Admitting: Physician Assistant

## 2014-02-22 ENCOUNTER — Other Ambulatory Visit: Payer: Medicare Other

## 2014-02-22 ENCOUNTER — Telehealth: Payer: Self-pay | Admitting: *Deleted

## 2014-02-22 DIAGNOSIS — R140 Abdominal distension (gaseous): Secondary | ICD-10-CM

## 2014-02-22 DIAGNOSIS — R197 Diarrhea, unspecified: Secondary | ICD-10-CM

## 2014-02-22 DIAGNOSIS — R143 Flatulence: Secondary | ICD-10-CM

## 2014-02-22 MED ORDER — METRONIDAZOLE 250 MG PO TABS
250.0000 mg | ORAL_TABLET | Freq: Three times a day (TID) | ORAL | Status: AC
Start: 2014-02-22 — End: 2014-03-04

## 2014-02-22 NOTE — Telephone Encounter (Signed)
I called the patient and told her I am in contact with [employer] for the prior authorization and they are calling me back after a clinical review is completed.

## 2014-02-22 NOTE — Telephone Encounter (Signed)
I called the patient to advised her portions of payment for the Xifaxan is $131.65. The patient said that was too expensive. Per Amy Esterwood PA-C, we are sending the Metronidazole 250 mg, 3 times daily for 10 days.  I told the patient to call her pharmacy to get the price before she picks it up.

## 2014-02-23 ENCOUNTER — Telehealth: Payer: Self-pay | Admitting: Physician Assistant

## 2014-02-23 LAB — GASTROINTESTINAL PATHOGEN PANEL PCR
C. DIFFICILE TOX A/B, PCR: NEGATIVE
Campylobacter, PCR: NEGATIVE
Cryptosporidium, PCR: NEGATIVE
E COLI (ETEC) LT/ST, PCR: NEGATIVE
E COLI (STEC) STX1/STX2, PCR: NEGATIVE
E COLI 0157, PCR: NEGATIVE
GIARDIA LAMBLIA, PCR: NEGATIVE
Norovirus, PCR: NEGATIVE
Rotavirus A, PCR: NEGATIVE
Salmonella, PCR: NEGATIVE
Shigella, PCR: NEGATIVE

## 2014-02-23 NOTE — Telephone Encounter (Signed)
I called the patient and she asked even though the Flagyl is on ly $2.00, she was wondering once the stool test results come in if she should get the Xifaxan even though it is $131.00.  She wondered if she takes the Flagyl then switches to the Xifaxan would that hurt her.

## 2014-04-03 ENCOUNTER — Ambulatory Visit: Payer: Medicare Other | Admitting: Internal Medicine

## 2014-06-25 IMAGING — CT CT ABD-PELV W/ CM
2 of 5 series · 17 of 46 positions shown, 19 images · IV contrast (Omnipaque 300)
Comparison: 12/11/2011

CLINICAL DATA: Right upper quadrant and left upper quadrant
abdominal pain.  Nausea.

CT ABDOMEN AND PELVIS WITH CONTRAST
TECHNIQUE: Multidetector CT imaging of the abdomen and pelvis was
performed following the standard protocol during bolus
administration of intravenous contrast.
Contrast: 100mL OMNIPAQUE IOHEXOL 300 MG/ML  SOLN

[Series 2: abd/ pel 5mm · axial · 0.83mm/px · z∈[-369,+36]mm · 14 of 91 slices shown, 16 images]
[im 5/91  soft-tissue]
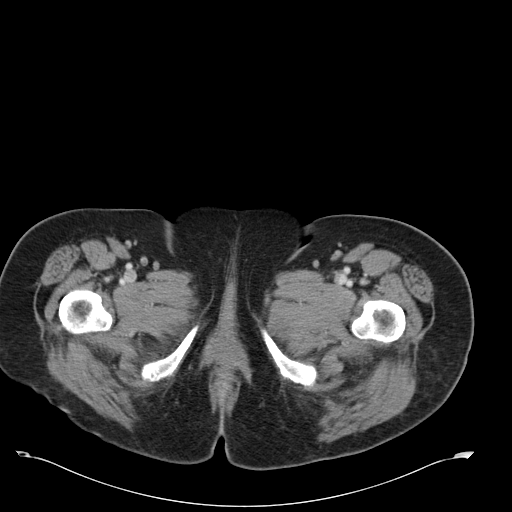
[im 5/91  bone]
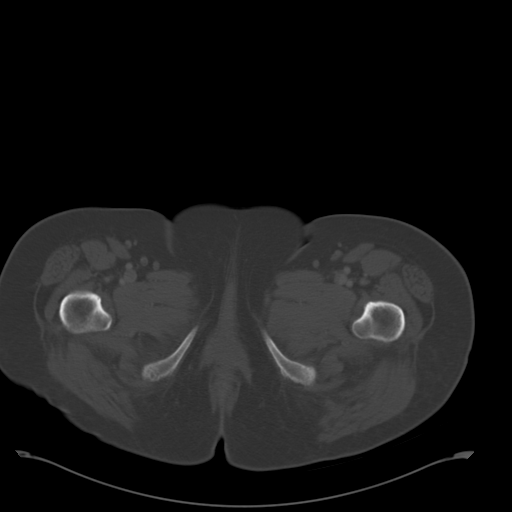
[im 13/91  soft-tissue]
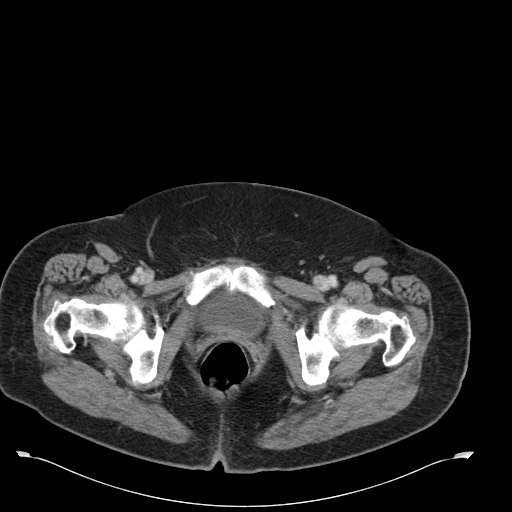
[im 18/91  soft-tissue]
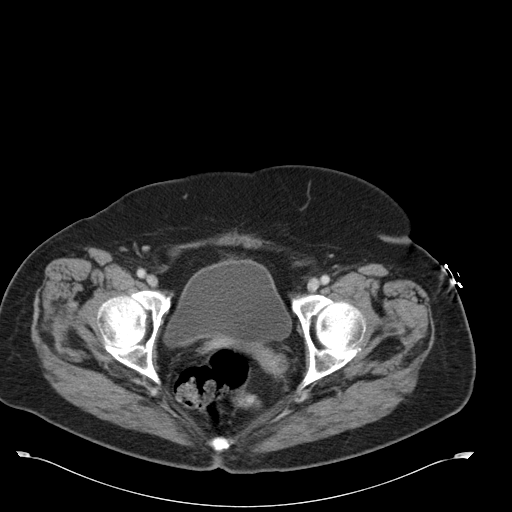
[im 26/91  soft-tissue]
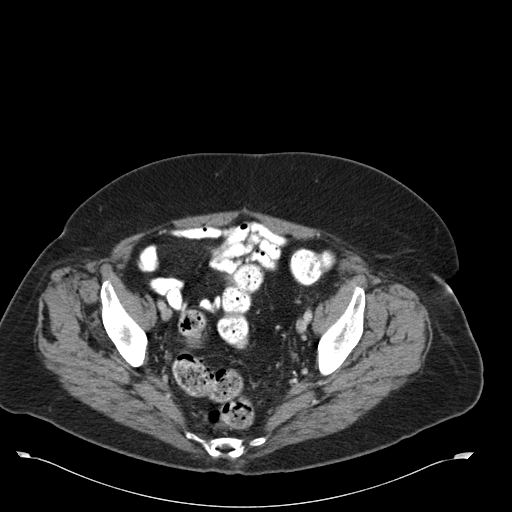
[im 31/91  soft-tissue]
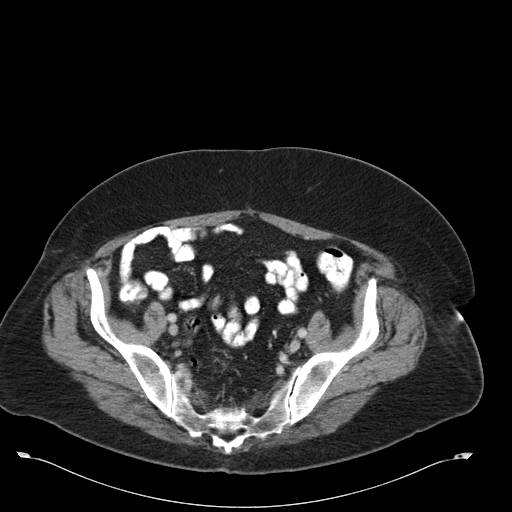
[im 35/91  soft-tissue]
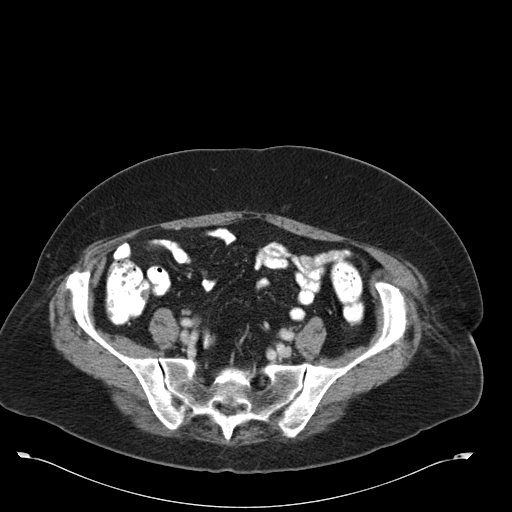
[im 43/91  soft-tissue]
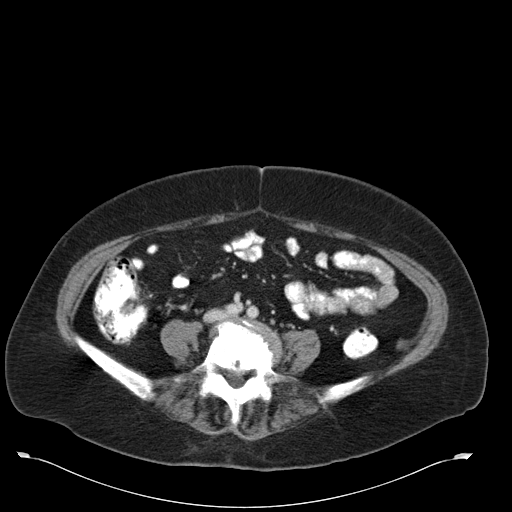
[im 48/91  soft-tissue]
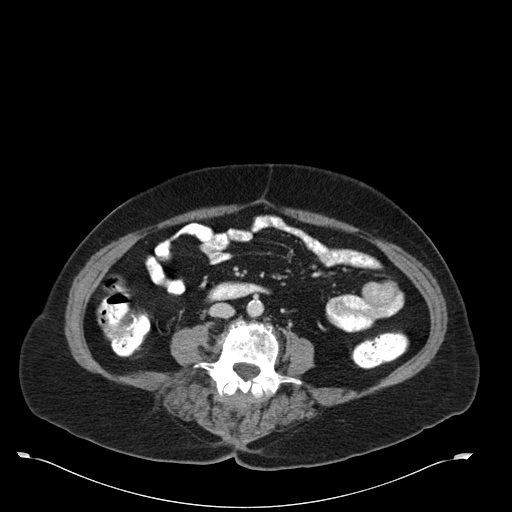
[im 56/91  soft-tissue]
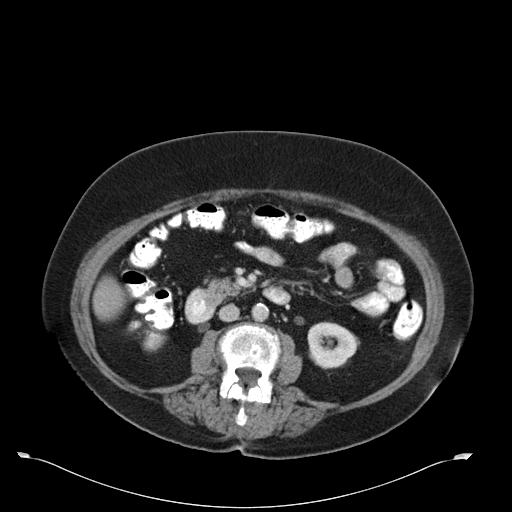
[im 56/91  bone]
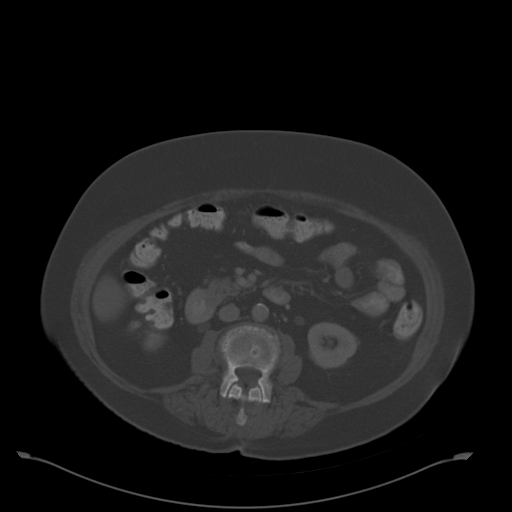
[im 61/91  soft-tissue]
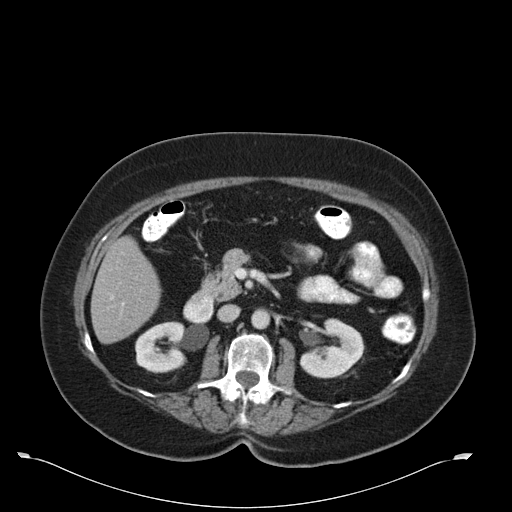
[im 69/91  soft-tissue]
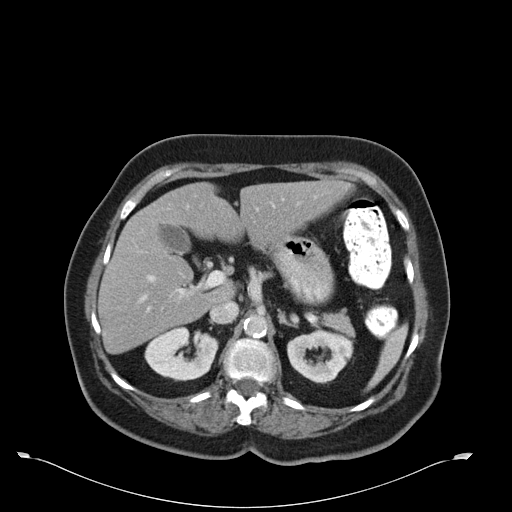
[im 73/91  soft-tissue]
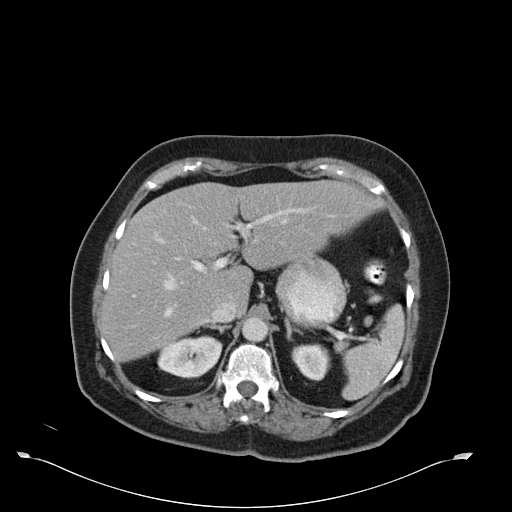
[im 78/91  soft-tissue]
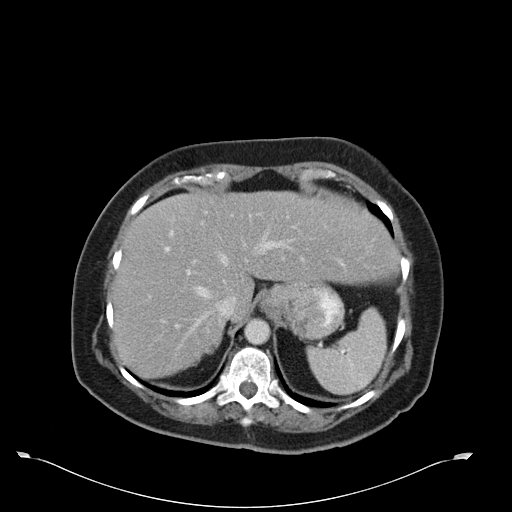
[im 86/91  soft-tissue]
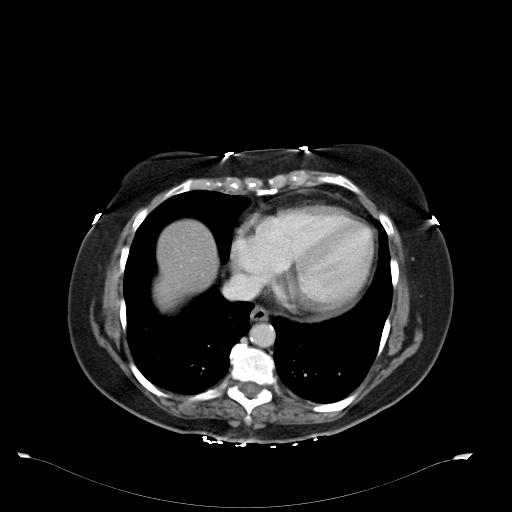

[Series 602: cor · coronal · 0.92mm/px · 3 of 125 slices shown]
[im 42/125  soft-tissue]
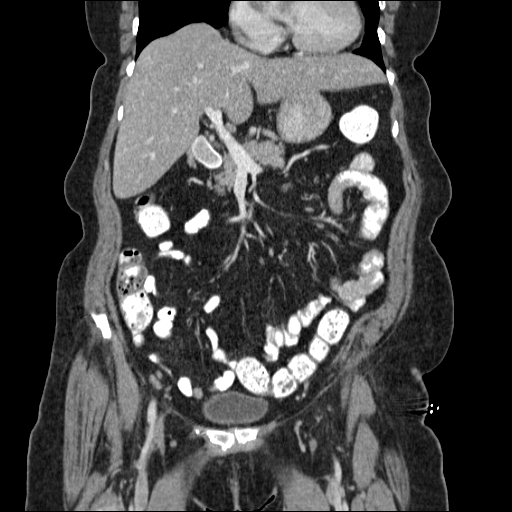
[im 56/125  soft-tissue]
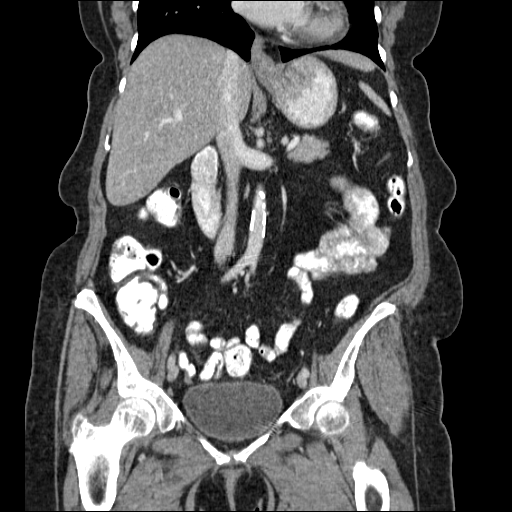
[im 69/125  soft-tissue]
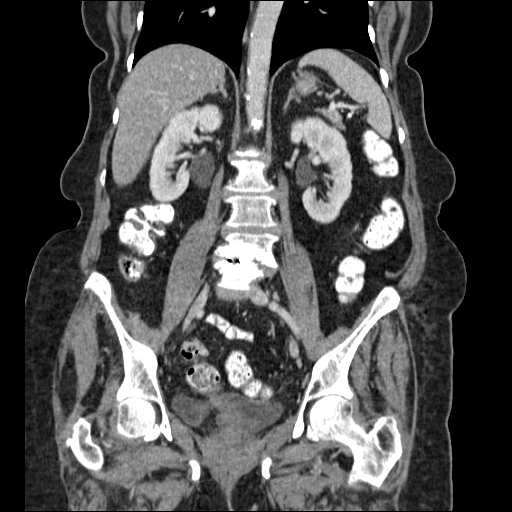

[17 of 46 positions shown; findings below may reference images not displayed]

FINDINGS: The liver, gallbladder, pancreas, spleen, and adrenal
glands are normal in appearance.  A few tiny sub-centimeter left
renal cyst are stable there is no evidence of renal masses or
hydronephrosis.

Prior hysterectomy noted.  Adnexal regions are unremarkable.  No
soft tissue masses or lymphadenopathy identified within the abdomen
or pelvis.

No evidence of inflammatory process or abnormal fluid collections.
No evidence of bowel wall thickening, dilatation, or hernia.  No
suspicious bone lesions identified. Advanced lower lumbar
degenerative disc disease again noted.
IMPRESSION: No acute findings or other significant abnormality identified.

## 2014-09-05 ENCOUNTER — Ambulatory Visit (INDEPENDENT_AMBULATORY_CARE_PROVIDER_SITE_OTHER): Payer: Medicare Other | Admitting: Internal Medicine

## 2014-09-05 ENCOUNTER — Encounter: Payer: Self-pay | Admitting: Internal Medicine

## 2014-09-05 VITALS — BP 100/62 | HR 68 | Ht 62.75 in | Wt 151.0 lb

## 2014-09-05 DIAGNOSIS — Z9049 Acquired absence of other specified parts of digestive tract: Secondary | ICD-10-CM

## 2014-09-05 DIAGNOSIS — R1011 Right upper quadrant pain: Secondary | ICD-10-CM

## 2014-09-05 DIAGNOSIS — K219 Gastro-esophageal reflux disease without esophagitis: Secondary | ICD-10-CM

## 2014-09-05 DIAGNOSIS — R197 Diarrhea, unspecified: Secondary | ICD-10-CM

## 2014-09-05 MED ORDER — DICYCLOMINE HCL 10 MG PO CAPS: 10.0000 mg | ORAL_CAPSULE | ORAL | Status: AC | PRN

## 2014-09-05 MED ORDER — CHOLESTYRAMINE 4 G PO PACK
PACK | ORAL | Status: DC
Start: 2014-09-05 — End: 2019-06-06

## 2014-09-05 NOTE — Progress Notes (Signed)
HISTORY OF PRESENT ILLNESS:  Pamela Hogan is a 80 y.o. female with past medical history as listed below who is followed in this office sporadically for chronic GERD, diverticular disease, IBS, and adenomatous colon polyps. She presents today for evaluation of ongoing problems with diarrhea. I last saw the patient in 2011. Her last colonoscopy in 2008 was negative for polyps. Random colon biopsies were negative. She was last seen in this office by the physician assistant 02/21/2014 for problems with episodic diarrhea, abdominal bloating, and gas. Patient underwent multiple stool studies which were negative. She took empiric course of metronidazole. Her problem seemed to improve for a short while, only to return. She tells me that the problems began shortly after gallbladder removal. She describes abdominal cramping with urgency followed by loose bowel movement. Abdominal discomfort improves. She reports anywhere between 2 and 6 bowel movements per day. Her appetite is good and weight stable. She denies rectal bleeding. She is on multiple medications as listed. We did receive outside blood work collected 08/01/2014. CBC was normal. Comprehensive metabolic panel was normal. Urinalysis was negative. In a separate issue, the patient requests that we can assist her in receiving her Nexium. She is currently taking 40 mg twice daily and ranitidine at night. She tells me that this was prescribed by your nose and throat specialist. She has not followed up there. Reasons for the change in medication unclear. She was not having any breakthrough reflux, dysphagia, or regurgitation. Should be noted that she has tried dicyclomine for her abdominal discomfort, which helps. She also requests a refill.  REVIEW OF SYSTEMS:  All non-GI ROS negative upon complete and extensive review of all systems  Past Medical History  Diagnosis Date  . Diverticulosis of colon (without mention of hemorrhage)   . Benign neoplasm of  colon   . Stricture and stenosis of esophagus   . GERD (gastroesophageal reflux disease)   . Hemorrhoids   . Hypertension   . Arthritis   . MVP (mitral valve prolapse)   . DDD (degenerative disc disease)   . Gastritis   . Colon polyp     Past Surgical History  Procedure Laterality Date  . Abdominal hysterectomy    . Breast surgery    . Appendectomy    . Knee arthroscopy      Bilateral   . Foot surgery      Right   . Back surgery      For DDD   . Neck surgery    . Tonsillectomy      80 yrs old   . Cholecystectomy      Social History Pamela Hogan  reports that she has never smoked. She has never used smokeless tobacco. She reports that she does not drink alcohol or use illicit drugs.  family history includes Breast cancer in her sister; Colon cancer in her maternal aunt; Heart disease in her brother; Irritable bowel syndrome in her mother.  Allergies  Allergen Reactions  . Iohexol     Some type of reaction to a dye 45 years ago       PHYSICAL EXAMINATION: Vital signs: BP 100/62 mmHg  Pulse 68  Ht 5' 2.75" (1.594 m)  Wt 151 lb (68.493 kg)  BMI 26.96 kg/m2 General: Well-developed, well-nourished, no acute distress HEENT: Sclerae are anicteric, conjunctiva pink. Oral mucosa intact. No thrush Lungs: Clear Heart: Regular Abdomen: soft, obese, nontender, nondistended, no obvious ascites, no peritoneal signs, normal bowel sounds. No organomegaly. Extremities: No edema  Neuro: Cranial nerves intact as are deep tendon reflexes Psychiatric: alert and oriented x3. Cooperative    ASSESSMENT:  #1. Abdominal cramping discomfort with urgency and loose stools. Seems to occur after cholecystectomy. Suspect bile salt related diarrhea. #2. History of IBS. May be contributing #3. Chronic GERD. I think her PPI dose can be decreased to 40 mg daily. She has known to have an esophageal stricture which has been asymptomatic. #4. History of adenomatous colon polyps. Last  complete colonoscopy 2008 was negative for neoplasia   PLAN:  #1. Prescribe Questran 4 g twice a day. Discussed with patient the proper timing of the medication related to her other medications. May titrate #2. Prescribed Bentyl (refilled today), take as directed #3. Decrease Nexium to 40 mg daily. Assist patient with obtaining prescription medication #4. Consider surveillance colonoscopy 2018 #5. Routine patient follow-up in 3-6 months.

## 2014-09-05 NOTE — Patient Instructions (Signed)
We have sent the following medications to your pharmacy for you to pick up at your convenience:  Dycyclomine, Lucrezia Starch

## 2014-09-06 ENCOUNTER — Telehealth: Payer: Self-pay

## 2014-09-06 NOTE — Telephone Encounter (Signed)
Initiated prior authorization for Nexium through Cover My Meds.  Awaiting response.

## 2015-05-01 ENCOUNTER — Encounter: Payer: Self-pay | Admitting: Internal Medicine

## 2015-06-05 DIAGNOSIS — R0602 Shortness of breath: Secondary | ICD-10-CM | POA: Insufficient documentation

## 2015-06-05 DIAGNOSIS — I10 Essential (primary) hypertension: Secondary | ICD-10-CM

## 2015-06-05 HISTORY — DX: Essential (primary) hypertension: I10

## 2015-06-05 HISTORY — DX: Shortness of breath: R06.02

## 2015-08-27 DIAGNOSIS — M2041 Other hammer toe(s) (acquired), right foot: Secondary | ICD-10-CM | POA: Insufficient documentation

## 2015-08-27 DIAGNOSIS — M7752 Other enthesopathy of left foot: Secondary | ICD-10-CM | POA: Insufficient documentation

## 2016-06-16 DIAGNOSIS — R61 Generalized hyperhidrosis: Secondary | ICD-10-CM

## 2016-06-16 DIAGNOSIS — K861 Other chronic pancreatitis: Secondary | ICD-10-CM | POA: Insufficient documentation

## 2016-06-16 DIAGNOSIS — Z87898 Personal history of other specified conditions: Secondary | ICD-10-CM | POA: Insufficient documentation

## 2016-06-16 DIAGNOSIS — R232 Flushing: Secondary | ICD-10-CM | POA: Insufficient documentation

## 2016-06-16 HISTORY — DX: Generalized hyperhidrosis: R61

## 2016-06-16 HISTORY — DX: Personal history of other specified conditions: Z87.898

## 2016-07-06 DIAGNOSIS — L209 Atopic dermatitis, unspecified: Secondary | ICD-10-CM | POA: Diagnosis not present

## 2016-07-06 DIAGNOSIS — L300 Nummular dermatitis: Secondary | ICD-10-CM | POA: Diagnosis not present

## 2016-07-06 DIAGNOSIS — D1801 Hemangioma of skin and subcutaneous tissue: Secondary | ICD-10-CM | POA: Diagnosis not present

## 2016-07-14 DIAGNOSIS — H353131 Nonexudative age-related macular degeneration, bilateral, early dry stage: Secondary | ICD-10-CM | POA: Diagnosis not present

## 2016-07-14 DIAGNOSIS — H26493 Other secondary cataract, bilateral: Secondary | ICD-10-CM | POA: Diagnosis not present

## 2016-07-21 DIAGNOSIS — C44112 Basal cell carcinoma of skin of right eyelid, including canthus: Secondary | ICD-10-CM | POA: Diagnosis not present

## 2016-07-21 DIAGNOSIS — L209 Atopic dermatitis, unspecified: Secondary | ICD-10-CM | POA: Diagnosis not present

## 2016-08-10 DIAGNOSIS — Z1231 Encounter for screening mammogram for malignant neoplasm of breast: Secondary | ICD-10-CM | POA: Diagnosis not present

## 2016-08-10 DIAGNOSIS — M8589 Other specified disorders of bone density and structure, multiple sites: Secondary | ICD-10-CM | POA: Diagnosis not present

## 2016-08-10 DIAGNOSIS — F418 Other specified anxiety disorders: Secondary | ICD-10-CM | POA: Diagnosis not present

## 2016-08-10 DIAGNOSIS — I10 Essential (primary) hypertension: Secondary | ICD-10-CM | POA: Diagnosis not present

## 2016-08-10 DIAGNOSIS — Z6827 Body mass index (BMI) 27.0-27.9, adult: Secondary | ICD-10-CM | POA: Diagnosis not present

## 2016-08-10 DIAGNOSIS — R5381 Other malaise: Secondary | ICD-10-CM | POA: Diagnosis not present

## 2016-08-10 DIAGNOSIS — E7800 Pure hypercholesterolemia, unspecified: Secondary | ICD-10-CM | POA: Diagnosis not present

## 2016-08-10 DIAGNOSIS — Z0000 Encounter for general adult medical examination without abnormal findings: Secondary | ICD-10-CM | POA: Diagnosis not present

## 2016-08-10 DIAGNOSIS — Z9181 History of falling: Secondary | ICD-10-CM | POA: Diagnosis not present

## 2016-08-24 DIAGNOSIS — Z6827 Body mass index (BMI) 27.0-27.9, adult: Secondary | ICD-10-CM | POA: Diagnosis not present

## 2016-08-24 DIAGNOSIS — M542 Cervicalgia: Secondary | ICD-10-CM | POA: Diagnosis not present

## 2016-08-24 DIAGNOSIS — M8589 Other specified disorders of bone density and structure, multiple sites: Secondary | ICD-10-CM | POA: Diagnosis not present

## 2016-08-24 DIAGNOSIS — M810 Age-related osteoporosis without current pathological fracture: Secondary | ICD-10-CM | POA: Diagnosis not present

## 2016-08-24 DIAGNOSIS — I700 Atherosclerosis of aorta: Secondary | ICD-10-CM | POA: Diagnosis not present

## 2016-08-24 DIAGNOSIS — R0689 Other abnormalities of breathing: Secondary | ICD-10-CM | POA: Diagnosis not present

## 2016-08-24 DIAGNOSIS — M5184 Other intervertebral disc disorders, thoracic region: Secondary | ICD-10-CM | POA: Diagnosis not present

## 2016-08-24 DIAGNOSIS — R51 Headache: Secondary | ICD-10-CM | POA: Diagnosis not present

## 2016-08-24 DIAGNOSIS — M5134 Other intervertebral disc degeneration, thoracic region: Secondary | ICD-10-CM | POA: Diagnosis not present

## 2016-08-24 DIAGNOSIS — M546 Pain in thoracic spine: Secondary | ICD-10-CM | POA: Diagnosis not present

## 2016-08-24 DIAGNOSIS — M50321 Other cervical disc degeneration at C4-C5 level: Secondary | ICD-10-CM | POA: Diagnosis not present

## 2016-08-24 DIAGNOSIS — R0600 Dyspnea, unspecified: Secondary | ICD-10-CM | POA: Diagnosis not present

## 2016-08-24 DIAGNOSIS — Z981 Arthrodesis status: Secondary | ICD-10-CM | POA: Diagnosis not present

## 2016-09-10 DIAGNOSIS — M5031 Other cervical disc degeneration,  high cervical region: Secondary | ICD-10-CM | POA: Diagnosis not present

## 2016-09-10 DIAGNOSIS — M25511 Pain in right shoulder: Secondary | ICD-10-CM | POA: Diagnosis not present

## 2016-09-10 DIAGNOSIS — M542 Cervicalgia: Secondary | ICD-10-CM | POA: Diagnosis not present

## 2016-09-10 DIAGNOSIS — M25512 Pain in left shoulder: Secondary | ICD-10-CM | POA: Diagnosis not present

## 2016-09-10 DIAGNOSIS — M5032 Other cervical disc degeneration, mid-cervical region, unspecified level: Secondary | ICD-10-CM | POA: Diagnosis not present

## 2016-09-10 DIAGNOSIS — M792 Neuralgia and neuritis, unspecified: Secondary | ICD-10-CM | POA: Diagnosis not present

## 2016-09-14 DIAGNOSIS — K2270 Barrett's esophagus without dysplasia: Secondary | ICD-10-CM | POA: Diagnosis not present

## 2016-09-14 DIAGNOSIS — Z86010 Personal history of colonic polyps: Secondary | ICD-10-CM | POA: Diagnosis not present

## 2016-09-16 DIAGNOSIS — Z86010 Personal history of colonic polyps: Secondary | ICD-10-CM | POA: Diagnosis not present

## 2016-09-16 DIAGNOSIS — K562 Volvulus: Secondary | ICD-10-CM | POA: Diagnosis not present

## 2016-09-16 DIAGNOSIS — Z8719 Personal history of other diseases of the digestive system: Secondary | ICD-10-CM | POA: Diagnosis not present

## 2016-09-16 DIAGNOSIS — I10 Essential (primary) hypertension: Secondary | ICD-10-CM | POA: Diagnosis not present

## 2016-09-16 DIAGNOSIS — K648 Other hemorrhoids: Secondary | ICD-10-CM | POA: Diagnosis not present

## 2016-09-16 DIAGNOSIS — M1990 Unspecified osteoarthritis, unspecified site: Secondary | ICD-10-CM | POA: Diagnosis not present

## 2016-09-16 DIAGNOSIS — K449 Diaphragmatic hernia without obstruction or gangrene: Secondary | ICD-10-CM | POA: Diagnosis not present

## 2016-09-16 DIAGNOSIS — Z79899 Other long term (current) drug therapy: Secondary | ICD-10-CM | POA: Diagnosis not present

## 2016-09-16 DIAGNOSIS — K219 Gastro-esophageal reflux disease without esophagitis: Secondary | ICD-10-CM | POA: Diagnosis not present

## 2016-09-16 DIAGNOSIS — Z9049 Acquired absence of other specified parts of digestive tract: Secondary | ICD-10-CM | POA: Diagnosis not present

## 2016-09-16 DIAGNOSIS — K5730 Diverticulosis of large intestine without perforation or abscess without bleeding: Secondary | ICD-10-CM | POA: Diagnosis not present

## 2016-09-16 DIAGNOSIS — K644 Residual hemorrhoidal skin tags: Secondary | ICD-10-CM | POA: Diagnosis not present

## 2016-09-18 DIAGNOSIS — K640 First degree hemorrhoids: Secondary | ICD-10-CM | POA: Diagnosis not present

## 2016-09-18 DIAGNOSIS — K219 Gastro-esophageal reflux disease without esophagitis: Secondary | ICD-10-CM | POA: Diagnosis not present

## 2016-09-18 DIAGNOSIS — M1990 Unspecified osteoarthritis, unspecified site: Secondary | ICD-10-CM | POA: Diagnosis not present

## 2016-09-18 DIAGNOSIS — R0602 Shortness of breath: Secondary | ICD-10-CM | POA: Diagnosis not present

## 2016-09-18 DIAGNOSIS — G629 Polyneuropathy, unspecified: Secondary | ICD-10-CM | POA: Diagnosis not present

## 2016-09-18 DIAGNOSIS — G459 Transient cerebral ischemic attack, unspecified: Secondary | ICD-10-CM | POA: Diagnosis not present

## 2016-09-18 DIAGNOSIS — Z86010 Personal history of colonic polyps: Secondary | ICD-10-CM | POA: Diagnosis not present

## 2016-09-18 DIAGNOSIS — K279 Peptic ulcer, site unspecified, unspecified as acute or chronic, without hemorrhage or perforation: Secondary | ICD-10-CM | POA: Diagnosis not present

## 2016-09-18 DIAGNOSIS — I10 Essential (primary) hypertension: Secondary | ICD-10-CM | POA: Diagnosis not present

## 2016-09-18 DIAGNOSIS — K5730 Diverticulosis of large intestine without perforation or abscess without bleeding: Secondary | ICD-10-CM | POA: Diagnosis not present

## 2016-09-28 DIAGNOSIS — Z6827 Body mass index (BMI) 27.0-27.9, adult: Secondary | ICD-10-CM | POA: Diagnosis not present

## 2016-09-28 DIAGNOSIS — E663 Overweight: Secondary | ICD-10-CM | POA: Diagnosis not present

## 2016-09-28 DIAGNOSIS — J309 Allergic rhinitis, unspecified: Secondary | ICD-10-CM | POA: Diagnosis not present

## 2016-10-30 DIAGNOSIS — M62551 Muscle wasting and atrophy, not elsewhere classified, right thigh: Secondary | ICD-10-CM | POA: Diagnosis not present

## 2016-10-30 DIAGNOSIS — M542 Cervicalgia: Secondary | ICD-10-CM | POA: Diagnosis not present

## 2016-10-30 DIAGNOSIS — M62552 Muscle wasting and atrophy, not elsewhere classified, left thigh: Secondary | ICD-10-CM | POA: Diagnosis not present

## 2016-10-30 DIAGNOSIS — M545 Low back pain: Secondary | ICD-10-CM | POA: Diagnosis not present

## 2016-11-02 DIAGNOSIS — M545 Low back pain: Secondary | ICD-10-CM | POA: Diagnosis not present

## 2016-11-02 DIAGNOSIS — M542 Cervicalgia: Secondary | ICD-10-CM | POA: Diagnosis not present

## 2016-11-02 DIAGNOSIS — M62551 Muscle wasting and atrophy, not elsewhere classified, right thigh: Secondary | ICD-10-CM | POA: Diagnosis not present

## 2016-11-02 DIAGNOSIS — M62552 Muscle wasting and atrophy, not elsewhere classified, left thigh: Secondary | ICD-10-CM | POA: Diagnosis not present

## 2016-11-04 DIAGNOSIS — M62552 Muscle wasting and atrophy, not elsewhere classified, left thigh: Secondary | ICD-10-CM | POA: Diagnosis not present

## 2016-11-04 DIAGNOSIS — M542 Cervicalgia: Secondary | ICD-10-CM | POA: Diagnosis not present

## 2016-11-04 DIAGNOSIS — M545 Low back pain: Secondary | ICD-10-CM | POA: Diagnosis not present

## 2016-11-04 DIAGNOSIS — M62551 Muscle wasting and atrophy, not elsewhere classified, right thigh: Secondary | ICD-10-CM | POA: Diagnosis not present

## 2016-11-06 DIAGNOSIS — M62552 Muscle wasting and atrophy, not elsewhere classified, left thigh: Secondary | ICD-10-CM | POA: Diagnosis not present

## 2016-11-06 DIAGNOSIS — M62551 Muscle wasting and atrophy, not elsewhere classified, right thigh: Secondary | ICD-10-CM | POA: Diagnosis not present

## 2016-11-06 DIAGNOSIS — M545 Low back pain: Secondary | ICD-10-CM | POA: Diagnosis not present

## 2016-11-06 DIAGNOSIS — M542 Cervicalgia: Secondary | ICD-10-CM | POA: Diagnosis not present

## 2016-11-12 DIAGNOSIS — M545 Low back pain: Secondary | ICD-10-CM | POA: Diagnosis not present

## 2016-11-12 DIAGNOSIS — M62551 Muscle wasting and atrophy, not elsewhere classified, right thigh: Secondary | ICD-10-CM | POA: Diagnosis not present

## 2016-11-12 DIAGNOSIS — M542 Cervicalgia: Secondary | ICD-10-CM | POA: Diagnosis not present

## 2016-11-12 DIAGNOSIS — M62552 Muscle wasting and atrophy, not elsewhere classified, left thigh: Secondary | ICD-10-CM | POA: Diagnosis not present

## 2016-11-16 DIAGNOSIS — H353131 Nonexudative age-related macular degeneration, bilateral, early dry stage: Secondary | ICD-10-CM | POA: Diagnosis not present

## 2016-11-18 DIAGNOSIS — M62551 Muscle wasting and atrophy, not elsewhere classified, right thigh: Secondary | ICD-10-CM | POA: Diagnosis not present

## 2016-11-18 DIAGNOSIS — M542 Cervicalgia: Secondary | ICD-10-CM | POA: Diagnosis not present

## 2016-11-18 DIAGNOSIS — M545 Low back pain: Secondary | ICD-10-CM | POA: Diagnosis not present

## 2016-11-18 DIAGNOSIS — M62552 Muscle wasting and atrophy, not elsewhere classified, left thigh: Secondary | ICD-10-CM | POA: Diagnosis not present

## 2016-11-20 DIAGNOSIS — M545 Low back pain: Secondary | ICD-10-CM | POA: Diagnosis not present

## 2016-11-20 DIAGNOSIS — M542 Cervicalgia: Secondary | ICD-10-CM | POA: Diagnosis not present

## 2016-11-20 DIAGNOSIS — M62551 Muscle wasting and atrophy, not elsewhere classified, right thigh: Secondary | ICD-10-CM | POA: Diagnosis not present

## 2016-11-20 DIAGNOSIS — M62552 Muscle wasting and atrophy, not elsewhere classified, left thigh: Secondary | ICD-10-CM | POA: Diagnosis not present

## 2016-12-09 DIAGNOSIS — F418 Other specified anxiety disorders: Secondary | ICD-10-CM | POA: Diagnosis not present

## 2016-12-09 DIAGNOSIS — I10 Essential (primary) hypertension: Secondary | ICD-10-CM | POA: Diagnosis not present

## 2016-12-09 DIAGNOSIS — Z1231 Encounter for screening mammogram for malignant neoplasm of breast: Secondary | ICD-10-CM | POA: Diagnosis not present

## 2016-12-09 DIAGNOSIS — Z139 Encounter for screening, unspecified: Secondary | ICD-10-CM | POA: Diagnosis not present

## 2016-12-09 DIAGNOSIS — E785 Hyperlipidemia, unspecified: Secondary | ICD-10-CM | POA: Diagnosis not present

## 2016-12-09 DIAGNOSIS — R0600 Dyspnea, unspecified: Secondary | ICD-10-CM | POA: Diagnosis not present

## 2016-12-18 DIAGNOSIS — Z1231 Encounter for screening mammogram for malignant neoplasm of breast: Secondary | ICD-10-CM | POA: Diagnosis not present

## 2017-01-18 DIAGNOSIS — R5381 Other malaise: Secondary | ICD-10-CM | POA: Diagnosis not present

## 2017-01-18 DIAGNOSIS — K210 Gastro-esophageal reflux disease with esophagitis: Secondary | ICD-10-CM | POA: Diagnosis not present

## 2017-01-18 DIAGNOSIS — I10 Essential (primary) hypertension: Secondary | ICD-10-CM | POA: Diagnosis not present

## 2017-01-18 DIAGNOSIS — R0600 Dyspnea, unspecified: Secondary | ICD-10-CM | POA: Diagnosis not present

## 2017-01-18 DIAGNOSIS — M159 Polyosteoarthritis, unspecified: Secondary | ICD-10-CM | POA: Diagnosis not present

## 2017-03-11 DIAGNOSIS — I341 Nonrheumatic mitral (valve) prolapse: Secondary | ICD-10-CM | POA: Insufficient documentation

## 2017-03-11 DIAGNOSIS — R079 Chest pain, unspecified: Secondary | ICD-10-CM | POA: Insufficient documentation

## 2017-03-11 DIAGNOSIS — R9431 Abnormal electrocardiogram [ECG] [EKG]: Secondary | ICD-10-CM | POA: Diagnosis not present

## 2017-03-11 DIAGNOSIS — R0600 Dyspnea, unspecified: Secondary | ICD-10-CM | POA: Diagnosis not present

## 2017-03-11 DIAGNOSIS — I10 Essential (primary) hypertension: Secondary | ICD-10-CM | POA: Diagnosis not present

## 2017-03-11 HISTORY — DX: Chest pain, unspecified: R07.9

## 2017-03-16 DIAGNOSIS — Z452 Encounter for adjustment and management of vascular access device: Secondary | ICD-10-CM | POA: Diagnosis not present

## 2017-03-16 DIAGNOSIS — R079 Chest pain, unspecified: Secondary | ICD-10-CM | POA: Diagnosis not present

## 2017-03-17 DIAGNOSIS — I6523 Occlusion and stenosis of bilateral carotid arteries: Secondary | ICD-10-CM | POA: Diagnosis not present

## 2017-03-17 DIAGNOSIS — K219 Gastro-esophageal reflux disease without esophagitis: Secondary | ICD-10-CM | POA: Diagnosis not present

## 2017-03-17 DIAGNOSIS — R918 Other nonspecific abnormal finding of lung field: Secondary | ICD-10-CM | POA: Diagnosis not present

## 2017-03-17 DIAGNOSIS — I25118 Atherosclerotic heart disease of native coronary artery with other forms of angina pectoris: Secondary | ICD-10-CM | POA: Diagnosis not present

## 2017-03-17 DIAGNOSIS — I2510 Atherosclerotic heart disease of native coronary artery without angina pectoris: Secondary | ICD-10-CM | POA: Diagnosis not present

## 2017-03-17 DIAGNOSIS — M1990 Unspecified osteoarthritis, unspecified site: Secondary | ICD-10-CM | POA: Diagnosis not present

## 2017-03-17 DIAGNOSIS — I10 Essential (primary) hypertension: Secondary | ICD-10-CM | POA: Diagnosis not present

## 2017-03-17 HISTORY — PX: LEFT HEART CATH AND CORONARY ANGIOGRAPHY: CATH118249

## 2017-03-24 DIAGNOSIS — I25119 Atherosclerotic heart disease of native coronary artery with unspecified angina pectoris: Secondary | ICD-10-CM | POA: Diagnosis not present

## 2017-03-24 DIAGNOSIS — Z7982 Long term (current) use of aspirin: Secondary | ICD-10-CM | POA: Diagnosis not present

## 2017-03-24 DIAGNOSIS — R54 Age-related physical debility: Secondary | ICD-10-CM | POA: Diagnosis not present

## 2017-03-25 DIAGNOSIS — I10 Essential (primary) hypertension: Secondary | ICD-10-CM | POA: Diagnosis not present

## 2017-03-25 DIAGNOSIS — Z7901 Long term (current) use of anticoagulants: Secondary | ICD-10-CM | POA: Diagnosis not present

## 2017-03-25 DIAGNOSIS — Z7982 Long term (current) use of aspirin: Secondary | ICD-10-CM | POA: Diagnosis not present

## 2017-03-25 DIAGNOSIS — I2510 Atherosclerotic heart disease of native coronary artery without angina pectoris: Secondary | ICD-10-CM | POA: Diagnosis present

## 2017-04-01 DIAGNOSIS — I2510 Atherosclerotic heart disease of native coronary artery without angina pectoris: Secondary | ICD-10-CM | POA: Diagnosis not present

## 2017-04-01 DIAGNOSIS — I10 Essential (primary) hypertension: Secondary | ICD-10-CM | POA: Diagnosis not present

## 2017-04-01 DIAGNOSIS — Z90710 Acquired absence of both cervix and uterus: Secondary | ICD-10-CM | POA: Diagnosis not present

## 2017-04-01 DIAGNOSIS — K219 Gastro-esophageal reflux disease without esophagitis: Secondary | ICD-10-CM | POA: Diagnosis not present

## 2017-04-01 DIAGNOSIS — Z7902 Long term (current) use of antithrombotics/antiplatelets: Secondary | ICD-10-CM | POA: Diagnosis not present

## 2017-04-01 DIAGNOSIS — I25118 Atherosclerotic heart disease of native coronary artery with other forms of angina pectoris: Secondary | ICD-10-CM | POA: Diagnosis not present

## 2017-04-01 DIAGNOSIS — Z7982 Long term (current) use of aspirin: Secondary | ICD-10-CM | POA: Diagnosis not present

## 2017-04-01 DIAGNOSIS — Z8673 Personal history of transient ischemic attack (TIA), and cerebral infarction without residual deficits: Secondary | ICD-10-CM | POA: Diagnosis not present

## 2017-04-01 DIAGNOSIS — K861 Other chronic pancreatitis: Secondary | ICD-10-CM | POA: Diagnosis not present

## 2017-04-01 HISTORY — PX: CORONARY STENT INTERVENTION: CATH118234

## 2017-04-14 DIAGNOSIS — Z139 Encounter for screening, unspecified: Secondary | ICD-10-CM | POA: Diagnosis not present

## 2017-04-14 DIAGNOSIS — Z2821 Immunization not carried out because of patient refusal: Secondary | ICD-10-CM | POA: Diagnosis not present

## 2017-04-22 DIAGNOSIS — Z95820 Peripheral vascular angioplasty status with implants and grafts: Secondary | ICD-10-CM | POA: Insufficient documentation

## 2017-04-22 DIAGNOSIS — Z7982 Long term (current) use of aspirin: Secondary | ICD-10-CM

## 2017-04-22 DIAGNOSIS — I10 Essential (primary) hypertension: Secondary | ICD-10-CM | POA: Diagnosis not present

## 2017-04-22 DIAGNOSIS — E785 Hyperlipidemia, unspecified: Secondary | ICD-10-CM

## 2017-04-22 DIAGNOSIS — K861 Other chronic pancreatitis: Secondary | ICD-10-CM | POA: Diagnosis not present

## 2017-04-22 DIAGNOSIS — K219 Gastro-esophageal reflux disease without esophagitis: Secondary | ICD-10-CM | POA: Diagnosis not present

## 2017-04-22 DIAGNOSIS — I2510 Atherosclerotic heart disease of native coronary artery without angina pectoris: Secondary | ICD-10-CM | POA: Diagnosis not present

## 2017-04-22 HISTORY — DX: Long term (current) use of aspirin: Z79.82

## 2017-04-22 HISTORY — DX: Hyperlipidemia, unspecified: E78.5

## 2017-05-25 DIAGNOSIS — H353131 Nonexudative age-related macular degeneration, bilateral, early dry stage: Secondary | ICD-10-CM | POA: Diagnosis not present

## 2017-05-25 DIAGNOSIS — H26493 Other secondary cataract, bilateral: Secondary | ICD-10-CM | POA: Diagnosis not present

## 2017-05-31 DIAGNOSIS — R262 Difficulty in walking, not elsewhere classified: Secondary | ICD-10-CM | POA: Diagnosis not present

## 2017-05-31 DIAGNOSIS — R2689 Other abnormalities of gait and mobility: Secondary | ICD-10-CM | POA: Diagnosis not present

## 2017-06-17 DIAGNOSIS — R2681 Unsteadiness on feet: Secondary | ICD-10-CM | POA: Diagnosis not present

## 2017-06-17 DIAGNOSIS — R201 Hypoesthesia of skin: Secondary | ICD-10-CM | POA: Diagnosis not present

## 2017-06-17 DIAGNOSIS — R262 Difficulty in walking, not elsewhere classified: Secondary | ICD-10-CM | POA: Diagnosis not present

## 2017-06-17 DIAGNOSIS — R51 Headache: Secondary | ICD-10-CM | POA: Diagnosis not present

## 2017-06-17 DIAGNOSIS — Z8673 Personal history of transient ischemic attack (TIA), and cerebral infarction without residual deficits: Secondary | ICD-10-CM | POA: Diagnosis not present

## 2017-06-17 DIAGNOSIS — R269 Unspecified abnormalities of gait and mobility: Secondary | ICD-10-CM | POA: Diagnosis not present

## 2017-06-17 DIAGNOSIS — R2689 Other abnormalities of gait and mobility: Secondary | ICD-10-CM | POA: Diagnosis not present

## 2017-06-17 DIAGNOSIS — R5383 Other fatigue: Secondary | ICD-10-CM | POA: Diagnosis not present

## 2017-06-17 DIAGNOSIS — M6281 Muscle weakness (generalized): Secondary | ICD-10-CM | POA: Diagnosis not present

## 2017-06-30 DIAGNOSIS — R5383 Other fatigue: Secondary | ICD-10-CM | POA: Diagnosis not present

## 2017-06-30 DIAGNOSIS — M545 Low back pain: Secondary | ICD-10-CM | POA: Diagnosis not present

## 2017-06-30 DIAGNOSIS — M2560 Stiffness of unspecified joint, not elsewhere classified: Secondary | ICD-10-CM | POA: Diagnosis not present

## 2017-06-30 DIAGNOSIS — M25611 Stiffness of right shoulder, not elsewhere classified: Secondary | ICD-10-CM | POA: Diagnosis not present

## 2017-06-30 DIAGNOSIS — R51 Headache: Secondary | ICD-10-CM | POA: Diagnosis not present

## 2017-06-30 DIAGNOSIS — R293 Abnormal posture: Secondary | ICD-10-CM | POA: Diagnosis not present

## 2017-06-30 DIAGNOSIS — M6281 Muscle weakness (generalized): Secondary | ICD-10-CM | POA: Diagnosis not present

## 2017-06-30 DIAGNOSIS — M79622 Pain in left upper arm: Secondary | ICD-10-CM | POA: Diagnosis not present

## 2017-06-30 DIAGNOSIS — M542 Cervicalgia: Secondary | ICD-10-CM | POA: Diagnosis not present

## 2017-06-30 DIAGNOSIS — M25612 Stiffness of left shoulder, not elsewhere classified: Secondary | ICD-10-CM | POA: Diagnosis not present

## 2017-06-30 DIAGNOSIS — M5489 Other dorsalgia: Secondary | ICD-10-CM | POA: Diagnosis not present

## 2017-06-30 DIAGNOSIS — R2689 Other abnormalities of gait and mobility: Secondary | ICD-10-CM | POA: Diagnosis not present

## 2017-06-30 DIAGNOSIS — R2681 Unsteadiness on feet: Secondary | ICD-10-CM | POA: Diagnosis not present

## 2017-06-30 DIAGNOSIS — R201 Hypoesthesia of skin: Secondary | ICD-10-CM | POA: Diagnosis not present

## 2017-07-02 DIAGNOSIS — R51 Headache: Secondary | ICD-10-CM | POA: Diagnosis not present

## 2017-07-27 DIAGNOSIS — R42 Dizziness and giddiness: Secondary | ICD-10-CM | POA: Diagnosis not present

## 2017-07-27 DIAGNOSIS — R51 Headache: Secondary | ICD-10-CM | POA: Diagnosis not present

## 2017-07-27 DIAGNOSIS — H7491 Unspecified disorder of right middle ear and mastoid: Secondary | ICD-10-CM | POA: Diagnosis not present

## 2017-07-27 DIAGNOSIS — H9202 Otalgia, left ear: Secondary | ICD-10-CM | POA: Diagnosis not present

## 2017-08-03 DIAGNOSIS — M545 Low back pain: Secondary | ICD-10-CM | POA: Diagnosis not present

## 2017-08-03 DIAGNOSIS — M2560 Stiffness of unspecified joint, not elsewhere classified: Secondary | ICD-10-CM | POA: Diagnosis not present

## 2017-08-03 DIAGNOSIS — M25642 Stiffness of left hand, not elsewhere classified: Secondary | ICD-10-CM | POA: Diagnosis not present

## 2017-08-03 DIAGNOSIS — M79622 Pain in left upper arm: Secondary | ICD-10-CM | POA: Diagnosis not present

## 2017-08-03 DIAGNOSIS — R2681 Unsteadiness on feet: Secondary | ICD-10-CM | POA: Diagnosis not present

## 2017-08-03 DIAGNOSIS — R293 Abnormal posture: Secondary | ICD-10-CM | POA: Diagnosis not present

## 2017-08-03 DIAGNOSIS — R2689 Other abnormalities of gait and mobility: Secondary | ICD-10-CM | POA: Diagnosis not present

## 2017-08-03 DIAGNOSIS — R5383 Other fatigue: Secondary | ICD-10-CM | POA: Diagnosis not present

## 2017-08-03 DIAGNOSIS — R201 Hypoesthesia of skin: Secondary | ICD-10-CM | POA: Diagnosis not present

## 2017-08-03 DIAGNOSIS — R51 Headache: Secondary | ICD-10-CM | POA: Diagnosis not present

## 2017-08-03 DIAGNOSIS — M25611 Stiffness of right shoulder, not elsewhere classified: Secondary | ICD-10-CM | POA: Diagnosis not present

## 2017-08-03 DIAGNOSIS — M542 Cervicalgia: Secondary | ICD-10-CM | POA: Diagnosis not present

## 2017-08-03 DIAGNOSIS — M6281 Muscle weakness (generalized): Secondary | ICD-10-CM | POA: Diagnosis not present

## 2017-08-17 DIAGNOSIS — R5381 Other malaise: Secondary | ICD-10-CM | POA: Diagnosis not present

## 2017-08-17 DIAGNOSIS — E785 Hyperlipidemia, unspecified: Secondary | ICD-10-CM | POA: Diagnosis not present

## 2017-08-17 DIAGNOSIS — Z79899 Other long term (current) drug therapy: Secondary | ICD-10-CM | POA: Diagnosis not present

## 2017-08-19 DIAGNOSIS — Z1331 Encounter for screening for depression: Secondary | ICD-10-CM | POA: Diagnosis not present

## 2017-08-19 DIAGNOSIS — I10 Essential (primary) hypertension: Secondary | ICD-10-CM | POA: Diagnosis not present

## 2017-08-19 DIAGNOSIS — Z9181 History of falling: Secondary | ICD-10-CM | POA: Diagnosis not present

## 2017-08-19 DIAGNOSIS — Z2821 Immunization not carried out because of patient refusal: Secondary | ICD-10-CM | POA: Diagnosis not present

## 2017-08-19 DIAGNOSIS — E039 Hypothyroidism, unspecified: Secondary | ICD-10-CM | POA: Diagnosis not present

## 2017-08-19 DIAGNOSIS — I2510 Atherosclerotic heart disease of native coronary artery without angina pectoris: Secondary | ICD-10-CM | POA: Diagnosis not present

## 2017-08-19 DIAGNOSIS — F418 Other specified anxiety disorders: Secondary | ICD-10-CM | POA: Diagnosis not present

## 2017-08-19 DIAGNOSIS — Z23 Encounter for immunization: Secondary | ICD-10-CM | POA: Diagnosis not present

## 2017-08-19 DIAGNOSIS — E785 Hyperlipidemia, unspecified: Secondary | ICD-10-CM | POA: Diagnosis not present

## 2017-09-14 DIAGNOSIS — Z96653 Presence of artificial knee joint, bilateral: Secondary | ICD-10-CM | POA: Diagnosis not present

## 2017-09-14 DIAGNOSIS — Z96643 Presence of artificial hip joint, bilateral: Secondary | ICD-10-CM | POA: Diagnosis not present

## 2017-09-14 DIAGNOSIS — M7061 Trochanteric bursitis, right hip: Secondary | ICD-10-CM | POA: Diagnosis not present

## 2017-09-14 DIAGNOSIS — G8929 Other chronic pain: Secondary | ICD-10-CM | POA: Diagnosis not present

## 2017-09-14 DIAGNOSIS — M25551 Pain in right hip: Secondary | ICD-10-CM | POA: Diagnosis not present

## 2017-09-14 DIAGNOSIS — M79605 Pain in left leg: Secondary | ICD-10-CM | POA: Diagnosis not present

## 2017-09-14 DIAGNOSIS — M79604 Pain in right leg: Secondary | ICD-10-CM | POA: Diagnosis not present

## 2017-09-14 DIAGNOSIS — Z471 Aftercare following joint replacement surgery: Secondary | ICD-10-CM | POA: Diagnosis not present

## 2017-09-14 DIAGNOSIS — M25552 Pain in left hip: Secondary | ICD-10-CM | POA: Diagnosis not present

## 2017-09-30 DIAGNOSIS — Z23 Encounter for immunization: Secondary | ICD-10-CM | POA: Diagnosis not present

## 2017-09-30 DIAGNOSIS — Z1231 Encounter for screening mammogram for malignant neoplasm of breast: Secondary | ICD-10-CM | POA: Diagnosis not present

## 2017-09-30 DIAGNOSIS — Z0000 Encounter for general adult medical examination without abnormal findings: Secondary | ICD-10-CM | POA: Diagnosis not present

## 2017-09-30 DIAGNOSIS — Z136 Encounter for screening for cardiovascular disorders: Secondary | ICD-10-CM | POA: Diagnosis not present

## 2017-09-30 DIAGNOSIS — Z9181 History of falling: Secondary | ICD-10-CM | POA: Diagnosis not present

## 2017-09-30 DIAGNOSIS — E039 Hypothyroidism, unspecified: Secondary | ICD-10-CM | POA: Diagnosis not present

## 2017-09-30 DIAGNOSIS — I2510 Atherosclerotic heart disease of native coronary artery without angina pectoris: Secondary | ICD-10-CM | POA: Diagnosis not present

## 2017-09-30 DIAGNOSIS — R51 Headache: Secondary | ICD-10-CM | POA: Diagnosis not present

## 2017-09-30 DIAGNOSIS — E785 Hyperlipidemia, unspecified: Secondary | ICD-10-CM | POA: Diagnosis not present

## 2017-09-30 DIAGNOSIS — Z1331 Encounter for screening for depression: Secondary | ICD-10-CM | POA: Diagnosis not present

## 2017-09-30 DIAGNOSIS — N281 Cyst of kidney, acquired: Secondary | ICD-10-CM | POA: Diagnosis not present

## 2017-10-20 DIAGNOSIS — D649 Anemia, unspecified: Secondary | ICD-10-CM

## 2017-10-20 DIAGNOSIS — E785 Hyperlipidemia, unspecified: Secondary | ICD-10-CM | POA: Diagnosis not present

## 2017-10-20 DIAGNOSIS — I10 Essential (primary) hypertension: Secondary | ICD-10-CM | POA: Diagnosis not present

## 2017-10-20 DIAGNOSIS — I2510 Atherosclerotic heart disease of native coronary artery without angina pectoris: Secondary | ICD-10-CM | POA: Diagnosis not present

## 2017-10-20 DIAGNOSIS — K861 Other chronic pancreatitis: Secondary | ICD-10-CM | POA: Diagnosis not present

## 2017-10-20 HISTORY — DX: Anemia, unspecified: D64.9

## 2017-11-10 DIAGNOSIS — D649 Anemia, unspecified: Secondary | ICD-10-CM | POA: Diagnosis not present

## 2017-11-10 DIAGNOSIS — R079 Chest pain, unspecified: Secondary | ICD-10-CM | POA: Diagnosis not present

## 2017-11-10 DIAGNOSIS — I2510 Atherosclerotic heart disease of native coronary artery without angina pectoris: Secondary | ICD-10-CM | POA: Diagnosis not present

## 2017-11-10 DIAGNOSIS — E785 Hyperlipidemia, unspecified: Secondary | ICD-10-CM | POA: Diagnosis not present

## 2017-11-10 DIAGNOSIS — I10 Essential (primary) hypertension: Secondary | ICD-10-CM | POA: Diagnosis not present

## 2017-11-16 DIAGNOSIS — D649 Anemia, unspecified: Secondary | ICD-10-CM | POA: Diagnosis not present

## 2017-11-16 DIAGNOSIS — I10 Essential (primary) hypertension: Secondary | ICD-10-CM | POA: Diagnosis not present

## 2017-11-16 DIAGNOSIS — Z9861 Coronary angioplasty status: Secondary | ICD-10-CM | POA: Diagnosis not present

## 2017-11-16 DIAGNOSIS — E785 Hyperlipidemia, unspecified: Secondary | ICD-10-CM | POA: Diagnosis not present

## 2017-11-16 DIAGNOSIS — R0789 Other chest pain: Secondary | ICD-10-CM | POA: Diagnosis not present

## 2017-11-16 DIAGNOSIS — J984 Other disorders of lung: Secondary | ICD-10-CM | POA: Diagnosis not present

## 2017-11-16 DIAGNOSIS — I2510 Atherosclerotic heart disease of native coronary artery without angina pectoris: Secondary | ICD-10-CM | POA: Diagnosis not present

## 2017-11-16 HISTORY — PX: LEFT HEART CATH AND CORONARY ANGIOGRAPHY: CATH118249

## 2017-11-23 DIAGNOSIS — H353131 Nonexudative age-related macular degeneration, bilateral, early dry stage: Secondary | ICD-10-CM | POA: Diagnosis not present

## 2017-11-23 DIAGNOSIS — H26493 Other secondary cataract, bilateral: Secondary | ICD-10-CM | POA: Diagnosis not present

## 2017-12-02 DIAGNOSIS — D649 Anemia, unspecified: Secondary | ICD-10-CM | POA: Diagnosis not present

## 2017-12-02 DIAGNOSIS — Z79899 Other long term (current) drug therapy: Secondary | ICD-10-CM | POA: Diagnosis not present

## 2017-12-02 DIAGNOSIS — R531 Weakness: Secondary | ICD-10-CM | POA: Diagnosis not present

## 2017-12-02 DIAGNOSIS — G459 Transient cerebral ischemic attack, unspecified: Secondary | ICD-10-CM | POA: Diagnosis not present

## 2017-12-02 DIAGNOSIS — M792 Neuralgia and neuritis, unspecified: Secondary | ICD-10-CM | POA: Diagnosis not present

## 2017-12-06 DIAGNOSIS — I10 Essential (primary) hypertension: Secondary | ICD-10-CM | POA: Diagnosis not present

## 2017-12-06 DIAGNOSIS — N281 Cyst of kidney, acquired: Secondary | ICD-10-CM | POA: Diagnosis not present

## 2017-12-06 DIAGNOSIS — E785 Hyperlipidemia, unspecified: Secondary | ICD-10-CM | POA: Diagnosis not present

## 2017-12-06 DIAGNOSIS — M159 Polyosteoarthritis, unspecified: Secondary | ICD-10-CM | POA: Diagnosis not present

## 2017-12-06 DIAGNOSIS — F418 Other specified anxiety disorders: Secondary | ICD-10-CM | POA: Diagnosis not present

## 2017-12-14 DIAGNOSIS — N281 Cyst of kidney, acquired: Secondary | ICD-10-CM | POA: Diagnosis not present

## 2017-12-15 DIAGNOSIS — D649 Anemia, unspecified: Secondary | ICD-10-CM | POA: Diagnosis not present

## 2017-12-15 DIAGNOSIS — E785 Hyperlipidemia, unspecified: Secondary | ICD-10-CM | POA: Diagnosis not present

## 2017-12-15 DIAGNOSIS — I2510 Atherosclerotic heart disease of native coronary artery without angina pectoris: Secondary | ICD-10-CM | POA: Diagnosis not present

## 2017-12-15 DIAGNOSIS — I10 Essential (primary) hypertension: Secondary | ICD-10-CM | POA: Diagnosis not present

## 2017-12-20 DIAGNOSIS — Z1231 Encounter for screening mammogram for malignant neoplasm of breast: Secondary | ICD-10-CM | POA: Diagnosis not present

## 2017-12-23 DIAGNOSIS — I10 Essential (primary) hypertension: Secondary | ICD-10-CM | POA: Diagnosis not present

## 2017-12-23 DIAGNOSIS — G459 Transient cerebral ischemic attack, unspecified: Secondary | ICD-10-CM | POA: Diagnosis not present

## 2017-12-23 DIAGNOSIS — F418 Other specified anxiety disorders: Secondary | ICD-10-CM | POA: Diagnosis not present

## 2017-12-23 DIAGNOSIS — Z20818 Contact with and (suspected) exposure to other bacterial communicable diseases: Secondary | ICD-10-CM | POA: Diagnosis not present

## 2018-02-03 DIAGNOSIS — R5381 Other malaise: Secondary | ICD-10-CM | POA: Diagnosis not present

## 2018-02-03 DIAGNOSIS — E663 Overweight: Secondary | ICD-10-CM | POA: Diagnosis not present

## 2018-02-03 DIAGNOSIS — I10 Essential (primary) hypertension: Secondary | ICD-10-CM | POA: Diagnosis not present

## 2018-02-03 DIAGNOSIS — Z1339 Encounter for screening examination for other mental health and behavioral disorders: Secondary | ICD-10-CM | POA: Diagnosis not present

## 2018-02-03 DIAGNOSIS — Z6827 Body mass index (BMI) 27.0-27.9, adult: Secondary | ICD-10-CM | POA: Diagnosis not present

## 2018-02-03 DIAGNOSIS — F418 Other specified anxiety disorders: Secondary | ICD-10-CM | POA: Diagnosis not present

## 2018-03-10 DIAGNOSIS — I10 Essential (primary) hypertension: Secondary | ICD-10-CM | POA: Diagnosis not present

## 2018-03-10 DIAGNOSIS — I2510 Atherosclerotic heart disease of native coronary artery without angina pectoris: Secondary | ICD-10-CM | POA: Diagnosis not present

## 2018-03-10 DIAGNOSIS — S40022A Contusion of left upper arm, initial encounter: Secondary | ICD-10-CM | POA: Diagnosis not present

## 2018-03-10 DIAGNOSIS — Z79899 Other long term (current) drug therapy: Secondary | ICD-10-CM | POA: Diagnosis not present

## 2018-03-10 DIAGNOSIS — E663 Overweight: Secondary | ICD-10-CM | POA: Diagnosis not present

## 2018-03-10 DIAGNOSIS — Z6827 Body mass index (BMI) 27.0-27.9, adult: Secondary | ICD-10-CM | POA: Diagnosis not present

## 2018-03-10 DIAGNOSIS — E785 Hyperlipidemia, unspecified: Secondary | ICD-10-CM | POA: Diagnosis not present

## 2018-03-10 DIAGNOSIS — E039 Hypothyroidism, unspecified: Secondary | ICD-10-CM | POA: Diagnosis not present

## 2018-03-10 DIAGNOSIS — F418 Other specified anxiety disorders: Secondary | ICD-10-CM | POA: Diagnosis not present

## 2018-04-08 DIAGNOSIS — G894 Chronic pain syndrome: Secondary | ICD-10-CM | POA: Diagnosis not present

## 2018-04-08 DIAGNOSIS — M792 Neuralgia and neuritis, unspecified: Secondary | ICD-10-CM | POA: Diagnosis not present

## 2018-04-08 DIAGNOSIS — G459 Transient cerebral ischemic attack, unspecified: Secondary | ICD-10-CM | POA: Diagnosis not present

## 2018-04-08 HISTORY — DX: Chronic pain syndrome: G89.4

## 2018-04-22 DIAGNOSIS — Z7982 Long term (current) use of aspirin: Secondary | ICD-10-CM | POA: Diagnosis not present

## 2018-04-22 DIAGNOSIS — D649 Anemia, unspecified: Secondary | ICD-10-CM | POA: Diagnosis not present

## 2018-04-22 DIAGNOSIS — K861 Other chronic pancreatitis: Secondary | ICD-10-CM | POA: Diagnosis not present

## 2018-04-22 DIAGNOSIS — E785 Hyperlipidemia, unspecified: Secondary | ICD-10-CM | POA: Diagnosis not present

## 2018-04-22 DIAGNOSIS — I2510 Atherosclerotic heart disease of native coronary artery without angina pectoris: Secondary | ICD-10-CM | POA: Diagnosis not present

## 2018-04-22 DIAGNOSIS — I10 Essential (primary) hypertension: Secondary | ICD-10-CM | POA: Diagnosis not present

## 2018-05-05 DIAGNOSIS — M25642 Stiffness of left hand, not elsewhere classified: Secondary | ICD-10-CM | POA: Diagnosis not present

## 2018-05-05 DIAGNOSIS — K1379 Other lesions of oral mucosa: Secondary | ICD-10-CM | POA: Diagnosis not present

## 2018-05-05 DIAGNOSIS — Z6827 Body mass index (BMI) 27.0-27.9, adult: Secondary | ICD-10-CM | POA: Diagnosis not present

## 2018-05-05 DIAGNOSIS — I739 Peripheral vascular disease, unspecified: Secondary | ICD-10-CM | POA: Diagnosis not present

## 2018-05-05 DIAGNOSIS — H938X1 Other specified disorders of right ear: Secondary | ICD-10-CM | POA: Diagnosis not present

## 2018-05-16 DIAGNOSIS — I739 Peripheral vascular disease, unspecified: Secondary | ICD-10-CM | POA: Diagnosis not present

## 2018-05-16 DIAGNOSIS — M25551 Pain in right hip: Secondary | ICD-10-CM | POA: Diagnosis not present

## 2018-05-16 DIAGNOSIS — M25552 Pain in left hip: Secondary | ICD-10-CM | POA: Diagnosis not present

## 2018-05-31 DIAGNOSIS — H524 Presbyopia: Secondary | ICD-10-CM | POA: Diagnosis not present

## 2018-05-31 DIAGNOSIS — H353131 Nonexudative age-related macular degeneration, bilateral, early dry stage: Secondary | ICD-10-CM | POA: Diagnosis not present

## 2018-06-02 DIAGNOSIS — M7742 Metatarsalgia, left foot: Secondary | ICD-10-CM | POA: Diagnosis not present

## 2018-06-02 DIAGNOSIS — M7741 Metatarsalgia, right foot: Secondary | ICD-10-CM

## 2018-06-02 DIAGNOSIS — M2041 Other hammer toe(s) (acquired), right foot: Secondary | ICD-10-CM | POA: Diagnosis not present

## 2018-06-02 DIAGNOSIS — M19079 Primary osteoarthritis, unspecified ankle and foot: Secondary | ICD-10-CM | POA: Diagnosis not present

## 2018-06-02 HISTORY — DX: Metatarsalgia, right foot: M77.41

## 2018-06-13 DIAGNOSIS — E039 Hypothyroidism, unspecified: Secondary | ICD-10-CM | POA: Diagnosis not present

## 2018-06-13 DIAGNOSIS — E785 Hyperlipidemia, unspecified: Secondary | ICD-10-CM | POA: Diagnosis not present

## 2018-06-13 DIAGNOSIS — Z139 Encounter for screening, unspecified: Secondary | ICD-10-CM | POA: Diagnosis not present

## 2018-06-13 DIAGNOSIS — Z79899 Other long term (current) drug therapy: Secondary | ICD-10-CM | POA: Diagnosis not present

## 2018-06-13 DIAGNOSIS — R262 Difficulty in walking, not elsewhere classified: Secondary | ICD-10-CM | POA: Diagnosis not present

## 2018-06-13 DIAGNOSIS — I10 Essential (primary) hypertension: Secondary | ICD-10-CM | POA: Diagnosis not present

## 2018-06-13 DIAGNOSIS — I2510 Atherosclerotic heart disease of native coronary artery without angina pectoris: Secondary | ICD-10-CM | POA: Diagnosis not present

## 2018-06-13 DIAGNOSIS — F418 Other specified anxiety disorders: Secondary | ICD-10-CM | POA: Diagnosis not present

## 2018-06-13 DIAGNOSIS — Z6827 Body mass index (BMI) 27.0-27.9, adult: Secondary | ICD-10-CM | POA: Diagnosis not present

## 2018-06-30 DIAGNOSIS — Z01818 Encounter for other preprocedural examination: Secondary | ICD-10-CM | POA: Diagnosis not present

## 2018-07-01 DIAGNOSIS — I10 Essential (primary) hypertension: Secondary | ICD-10-CM | POA: Diagnosis not present

## 2018-07-01 DIAGNOSIS — Z955 Presence of coronary angioplasty implant and graft: Secondary | ICD-10-CM | POA: Diagnosis not present

## 2018-07-01 DIAGNOSIS — K219 Gastro-esophageal reflux disease without esophagitis: Secondary | ICD-10-CM | POA: Diagnosis not present

## 2018-07-01 DIAGNOSIS — Z79899 Other long term (current) drug therapy: Secondary | ICD-10-CM | POA: Diagnosis not present

## 2018-07-01 DIAGNOSIS — E039 Hypothyroidism, unspecified: Secondary | ICD-10-CM | POA: Diagnosis not present

## 2018-07-01 DIAGNOSIS — Z8673 Personal history of transient ischemic attack (TIA), and cerebral infarction without residual deficits: Secondary | ICD-10-CM | POA: Diagnosis not present

## 2018-07-01 DIAGNOSIS — K222 Esophageal obstruction: Secondary | ICD-10-CM | POA: Diagnosis not present

## 2018-07-01 DIAGNOSIS — Z9049 Acquired absence of other specified parts of digestive tract: Secondary | ICD-10-CM | POA: Diagnosis not present

## 2018-07-01 DIAGNOSIS — K2970 Gastritis, unspecified, without bleeding: Secondary | ICD-10-CM | POA: Diagnosis not present

## 2018-07-01 DIAGNOSIS — Z96653 Presence of artificial knee joint, bilateral: Secondary | ICD-10-CM | POA: Diagnosis not present

## 2018-07-01 DIAGNOSIS — K228 Other specified diseases of esophagus: Secondary | ICD-10-CM | POA: Diagnosis not present

## 2018-07-01 DIAGNOSIS — K3189 Other diseases of stomach and duodenum: Secondary | ICD-10-CM | POA: Diagnosis not present

## 2018-07-01 DIAGNOSIS — E785 Hyperlipidemia, unspecified: Secondary | ICD-10-CM | POA: Diagnosis not present

## 2018-07-01 DIAGNOSIS — M1990 Unspecified osteoarthritis, unspecified site: Secondary | ICD-10-CM | POA: Diagnosis not present

## 2018-07-01 DIAGNOSIS — K2270 Barrett's esophagus without dysplasia: Secondary | ICD-10-CM | POA: Diagnosis not present

## 2018-07-01 DIAGNOSIS — Z90710 Acquired absence of both cervix and uterus: Secondary | ICD-10-CM | POA: Diagnosis not present

## 2018-07-01 DIAGNOSIS — Z7982 Long term (current) use of aspirin: Secondary | ICD-10-CM | POA: Diagnosis not present

## 2018-07-01 DIAGNOSIS — K2900 Acute gastritis without bleeding: Secondary | ICD-10-CM | POA: Diagnosis not present

## 2018-07-01 DIAGNOSIS — Z9851 Tubal ligation status: Secondary | ICD-10-CM | POA: Diagnosis not present

## 2018-07-01 DIAGNOSIS — K449 Diaphragmatic hernia without obstruction or gangrene: Secondary | ICD-10-CM | POA: Diagnosis not present

## 2018-07-05 DIAGNOSIS — N644 Mastodynia: Secondary | ICD-10-CM | POA: Diagnosis not present

## 2018-07-05 DIAGNOSIS — Z6827 Body mass index (BMI) 27.0-27.9, adult: Secondary | ICD-10-CM | POA: Diagnosis not present

## 2018-07-11 DIAGNOSIS — N644 Mastodynia: Secondary | ICD-10-CM | POA: Diagnosis not present

## 2018-07-11 DIAGNOSIS — R921 Mammographic calcification found on diagnostic imaging of breast: Secondary | ICD-10-CM | POA: Diagnosis not present

## 2018-07-11 DIAGNOSIS — N6489 Other specified disorders of breast: Secondary | ICD-10-CM | POA: Diagnosis not present

## 2018-08-11 DIAGNOSIS — M545 Low back pain: Secondary | ICD-10-CM | POA: Diagnosis not present

## 2018-08-11 DIAGNOSIS — R5381 Other malaise: Secondary | ICD-10-CM | POA: Diagnosis not present

## 2018-08-11 DIAGNOSIS — Z6827 Body mass index (BMI) 27.0-27.9, adult: Secondary | ICD-10-CM | POA: Diagnosis not present

## 2018-11-25 DIAGNOSIS — Z6827 Body mass index (BMI) 27.0-27.9, adult: Secondary | ICD-10-CM | POA: Diagnosis not present

## 2018-11-25 DIAGNOSIS — E785 Hyperlipidemia, unspecified: Secondary | ICD-10-CM | POA: Diagnosis not present

## 2018-11-25 DIAGNOSIS — E039 Hypothyroidism, unspecified: Secondary | ICD-10-CM | POA: Diagnosis not present

## 2018-11-25 DIAGNOSIS — M159 Polyosteoarthritis, unspecified: Secondary | ICD-10-CM | POA: Diagnosis not present

## 2018-11-25 DIAGNOSIS — Z1331 Encounter for screening for depression: Secondary | ICD-10-CM | POA: Diagnosis not present

## 2018-11-25 DIAGNOSIS — E663 Overweight: Secondary | ICD-10-CM | POA: Diagnosis not present

## 2018-11-25 DIAGNOSIS — I10 Essential (primary) hypertension: Secondary | ICD-10-CM | POA: Diagnosis not present

## 2018-11-25 DIAGNOSIS — Z79899 Other long term (current) drug therapy: Secondary | ICD-10-CM | POA: Diagnosis not present

## 2018-11-25 DIAGNOSIS — Z9181 History of falling: Secondary | ICD-10-CM | POA: Diagnosis not present

## 2018-11-25 DIAGNOSIS — K210 Gastro-esophageal reflux disease with esophagitis: Secondary | ICD-10-CM | POA: Diagnosis not present

## 2018-11-25 DIAGNOSIS — K2270 Barrett's esophagus without dysplasia: Secondary | ICD-10-CM | POA: Diagnosis not present

## 2018-11-25 DIAGNOSIS — M797 Fibromyalgia: Secondary | ICD-10-CM | POA: Diagnosis not present

## 2018-12-06 DIAGNOSIS — D649 Anemia, unspecified: Secondary | ICD-10-CM | POA: Diagnosis not present

## 2018-12-06 DIAGNOSIS — I10 Essential (primary) hypertension: Secondary | ICD-10-CM | POA: Diagnosis not present

## 2018-12-06 DIAGNOSIS — Z7982 Long term (current) use of aspirin: Secondary | ICD-10-CM | POA: Diagnosis not present

## 2018-12-06 DIAGNOSIS — E785 Hyperlipidemia, unspecified: Secondary | ICD-10-CM | POA: Diagnosis not present

## 2018-12-06 DIAGNOSIS — I2510 Atherosclerotic heart disease of native coronary artery without angina pectoris: Secondary | ICD-10-CM | POA: Diagnosis not present

## 2018-12-06 DIAGNOSIS — K861 Other chronic pancreatitis: Secondary | ICD-10-CM | POA: Diagnosis not present

## 2018-12-06 DIAGNOSIS — I25119 Atherosclerotic heart disease of native coronary artery with unspecified angina pectoris: Secondary | ICD-10-CM | POA: Diagnosis not present

## 2018-12-22 DIAGNOSIS — S61031A Puncture wound without foreign body of right thumb without damage to nail, initial encounter: Secondary | ICD-10-CM | POA: Diagnosis not present

## 2018-12-22 DIAGNOSIS — Z23 Encounter for immunization: Secondary | ICD-10-CM | POA: Diagnosis not present

## 2018-12-22 DIAGNOSIS — Z6827 Body mass index (BMI) 27.0-27.9, adult: Secondary | ICD-10-CM | POA: Diagnosis not present

## 2018-12-22 DIAGNOSIS — L03011 Cellulitis of right finger: Secondary | ICD-10-CM | POA: Diagnosis not present

## 2018-12-22 DIAGNOSIS — K143 Hypertrophy of tongue papillae: Secondary | ICD-10-CM | POA: Diagnosis not present

## 2019-01-16 DIAGNOSIS — R531 Weakness: Secondary | ICD-10-CM | POA: Diagnosis not present

## 2019-01-16 DIAGNOSIS — Z96652 Presence of left artificial knee joint: Secondary | ICD-10-CM | POA: Diagnosis not present

## 2019-01-16 DIAGNOSIS — Z96651 Presence of right artificial knee joint: Secondary | ICD-10-CM | POA: Diagnosis not present

## 2019-01-16 DIAGNOSIS — G8929 Other chronic pain: Secondary | ICD-10-CM | POA: Diagnosis not present

## 2019-01-16 DIAGNOSIS — M25562 Pain in left knee: Secondary | ICD-10-CM | POA: Diagnosis not present

## 2019-01-16 DIAGNOSIS — M25561 Pain in right knee: Secondary | ICD-10-CM | POA: Diagnosis not present

## 2019-02-09 DIAGNOSIS — R109 Unspecified abdominal pain: Secondary | ICD-10-CM | POA: Diagnosis not present

## 2019-02-09 DIAGNOSIS — R079 Chest pain, unspecified: Secondary | ICD-10-CM | POA: Diagnosis not present

## 2019-02-09 DIAGNOSIS — Z6826 Body mass index (BMI) 26.0-26.9, adult: Secondary | ICD-10-CM | POA: Diagnosis not present

## 2019-02-10 DIAGNOSIS — Z7982 Long term (current) use of aspirin: Secondary | ICD-10-CM | POA: Diagnosis not present

## 2019-02-10 DIAGNOSIS — K143 Hypertrophy of tongue papillae: Secondary | ICD-10-CM | POA: Diagnosis not present

## 2019-02-10 DIAGNOSIS — J310 Chronic rhinitis: Secondary | ICD-10-CM | POA: Diagnosis not present

## 2019-02-10 DIAGNOSIS — K219 Gastro-esophageal reflux disease without esophagitis: Secondary | ICD-10-CM | POA: Diagnosis not present

## 2019-02-10 DIAGNOSIS — I10 Essential (primary) hypertension: Secondary | ICD-10-CM | POA: Diagnosis not present

## 2019-02-10 DIAGNOSIS — E785 Hyperlipidemia, unspecified: Secondary | ICD-10-CM | POA: Diagnosis not present

## 2019-02-10 DIAGNOSIS — D649 Anemia, unspecified: Secondary | ICD-10-CM | POA: Diagnosis not present

## 2019-02-10 DIAGNOSIS — I2510 Atherosclerotic heart disease of native coronary artery without angina pectoris: Secondary | ICD-10-CM | POA: Diagnosis not present

## 2019-02-10 DIAGNOSIS — K149 Disease of tongue, unspecified: Secondary | ICD-10-CM | POA: Diagnosis not present

## 2019-02-10 DIAGNOSIS — K861 Other chronic pancreatitis: Secondary | ICD-10-CM | POA: Diagnosis not present

## 2019-02-13 DIAGNOSIS — N6489 Other specified disorders of breast: Secondary | ICD-10-CM | POA: Diagnosis not present

## 2019-02-13 DIAGNOSIS — R928 Other abnormal and inconclusive findings on diagnostic imaging of breast: Secondary | ICD-10-CM | POA: Diagnosis not present

## 2019-02-24 DIAGNOSIS — E785 Hyperlipidemia, unspecified: Secondary | ICD-10-CM | POA: Diagnosis not present

## 2019-02-24 DIAGNOSIS — M797 Fibromyalgia: Secondary | ICD-10-CM | POA: Diagnosis not present

## 2019-02-24 DIAGNOSIS — I10 Essential (primary) hypertension: Secondary | ICD-10-CM | POA: Diagnosis not present

## 2019-02-24 DIAGNOSIS — Z6825 Body mass index (BMI) 25.0-25.9, adult: Secondary | ICD-10-CM | POA: Diagnosis not present

## 2019-02-24 DIAGNOSIS — F418 Other specified anxiety disorders: Secondary | ICD-10-CM | POA: Diagnosis not present

## 2019-02-24 DIAGNOSIS — E039 Hypothyroidism, unspecified: Secondary | ICD-10-CM | POA: Diagnosis not present

## 2019-02-24 DIAGNOSIS — E663 Overweight: Secondary | ICD-10-CM | POA: Diagnosis not present

## 2019-02-24 DIAGNOSIS — M159 Polyosteoarthritis, unspecified: Secondary | ICD-10-CM | POA: Diagnosis not present

## 2019-02-24 DIAGNOSIS — R109 Unspecified abdominal pain: Secondary | ICD-10-CM | POA: Diagnosis not present

## 2019-02-24 DIAGNOSIS — K2270 Barrett's esophagus without dysplasia: Secondary | ICD-10-CM | POA: Diagnosis not present

## 2019-02-24 DIAGNOSIS — K210 Gastro-esophageal reflux disease with esophagitis: Secondary | ICD-10-CM | POA: Diagnosis not present

## 2019-02-24 DIAGNOSIS — S20212A Contusion of left front wall of thorax, initial encounter: Secondary | ICD-10-CM | POA: Diagnosis not present

## 2019-03-03 DIAGNOSIS — I10 Essential (primary) hypertension: Secondary | ICD-10-CM | POA: Diagnosis not present

## 2019-03-03 DIAGNOSIS — Z7982 Long term (current) use of aspirin: Secondary | ICD-10-CM | POA: Diagnosis not present

## 2019-03-03 DIAGNOSIS — I2510 Atherosclerotic heart disease of native coronary artery without angina pectoris: Secondary | ICD-10-CM | POA: Diagnosis not present

## 2019-03-03 DIAGNOSIS — E785 Hyperlipidemia, unspecified: Secondary | ICD-10-CM | POA: Diagnosis not present

## 2019-03-03 DIAGNOSIS — D649 Anemia, unspecified: Secondary | ICD-10-CM | POA: Diagnosis not present

## 2019-03-03 DIAGNOSIS — K861 Other chronic pancreatitis: Secondary | ICD-10-CM | POA: Diagnosis not present

## 2019-03-13 DIAGNOSIS — Z86010 Personal history of colonic polyps: Secondary | ICD-10-CM | POA: Diagnosis not present

## 2019-03-13 DIAGNOSIS — R079 Chest pain, unspecified: Secondary | ICD-10-CM | POA: Diagnosis not present

## 2019-03-13 DIAGNOSIS — Z8719 Personal history of other diseases of the digestive system: Secondary | ICD-10-CM | POA: Diagnosis not present

## 2019-03-22 DIAGNOSIS — N281 Cyst of kidney, acquired: Secondary | ICD-10-CM | POA: Diagnosis not present

## 2019-03-22 DIAGNOSIS — Z8719 Personal history of other diseases of the digestive system: Secondary | ICD-10-CM | POA: Diagnosis not present

## 2019-03-22 DIAGNOSIS — K862 Cyst of pancreas: Secondary | ICD-10-CM | POA: Diagnosis not present

## 2019-04-16 DIAGNOSIS — R197 Diarrhea, unspecified: Secondary | ICD-10-CM | POA: Diagnosis not present

## 2019-04-24 DIAGNOSIS — I2510 Atherosclerotic heart disease of native coronary artery without angina pectoris: Secondary | ICD-10-CM | POA: Diagnosis not present

## 2019-04-24 DIAGNOSIS — R002 Palpitations: Secondary | ICD-10-CM | POA: Diagnosis not present

## 2019-04-24 DIAGNOSIS — I10 Essential (primary) hypertension: Secondary | ICD-10-CM | POA: Diagnosis not present

## 2019-04-24 DIAGNOSIS — Z6827 Body mass index (BMI) 27.0-27.9, adult: Secondary | ICD-10-CM | POA: Diagnosis not present

## 2019-04-24 DIAGNOSIS — F418 Other specified anxiety disorders: Secondary | ICD-10-CM | POA: Diagnosis not present

## 2019-04-26 DIAGNOSIS — K2270 Barrett's esophagus without dysplasia: Secondary | ICD-10-CM | POA: Diagnosis not present

## 2019-04-26 DIAGNOSIS — Z86010 Personal history of colonic polyps: Secondary | ICD-10-CM | POA: Diagnosis not present

## 2019-04-26 DIAGNOSIS — Z8719 Personal history of other diseases of the digestive system: Secondary | ICD-10-CM | POA: Diagnosis not present

## 2019-05-01 DIAGNOSIS — I160 Hypertensive urgency: Secondary | ICD-10-CM | POA: Diagnosis not present

## 2019-05-01 DIAGNOSIS — G459 Transient cerebral ischemic attack, unspecified: Secondary | ICD-10-CM | POA: Diagnosis not present

## 2019-05-01 DIAGNOSIS — I10 Essential (primary) hypertension: Secondary | ICD-10-CM | POA: Diagnosis not present

## 2019-05-05 DIAGNOSIS — Z6827 Body mass index (BMI) 27.0-27.9, adult: Secondary | ICD-10-CM | POA: Diagnosis not present

## 2019-05-05 DIAGNOSIS — I10 Essential (primary) hypertension: Secondary | ICD-10-CM | POA: Diagnosis not present

## 2019-05-05 DIAGNOSIS — Z2821 Immunization not carried out because of patient refusal: Secondary | ICD-10-CM | POA: Diagnosis not present

## 2019-05-30 DIAGNOSIS — Z0000 Encounter for general adult medical examination without abnormal findings: Secondary | ICD-10-CM | POA: Diagnosis not present

## 2019-05-30 DIAGNOSIS — E039 Hypothyroidism, unspecified: Secondary | ICD-10-CM | POA: Diagnosis not present

## 2019-05-30 DIAGNOSIS — E785 Hyperlipidemia, unspecified: Secondary | ICD-10-CM | POA: Diagnosis not present

## 2019-05-30 DIAGNOSIS — F418 Other specified anxiety disorders: Secondary | ICD-10-CM | POA: Diagnosis not present

## 2019-05-30 DIAGNOSIS — K2270 Barrett's esophagus without dysplasia: Secondary | ICD-10-CM | POA: Diagnosis not present

## 2019-05-30 DIAGNOSIS — Z1331 Encounter for screening for depression: Secondary | ICD-10-CM | POA: Diagnosis not present

## 2019-05-30 DIAGNOSIS — I10 Essential (primary) hypertension: Secondary | ICD-10-CM | POA: Diagnosis not present

## 2019-05-30 DIAGNOSIS — Z23 Encounter for immunization: Secondary | ICD-10-CM | POA: Diagnosis not present

## 2019-05-30 DIAGNOSIS — M159 Polyosteoarthritis, unspecified: Secondary | ICD-10-CM | POA: Diagnosis not present

## 2019-05-30 DIAGNOSIS — Z9181 History of falling: Secondary | ICD-10-CM | POA: Diagnosis not present

## 2019-05-30 DIAGNOSIS — M797 Fibromyalgia: Secondary | ICD-10-CM | POA: Diagnosis not present

## 2019-05-30 DIAGNOSIS — K2100 Gastro-esophageal reflux disease with esophagitis, without bleeding: Secondary | ICD-10-CM | POA: Diagnosis not present

## 2019-06-03 NOTE — Progress Notes (Signed)
Cardiology Office Note:    Date:  06/06/2019   ID:  Pamela Hogan, DOB 1934-08-23, MRN [id number]  PCP:  Lowella Dandy, NP  Cardiologist:  Shirlee More, MD   Referring MD: Lowella Dandy, NP  ASSESSMENT:    1. Coronary artery disease of native artery of native heart with stable angina pectoris ([place])   2. Essential hypertension   3. Mixed hyperlipidemia    PLAN:    In order of problems listed above:  1. Complex case she had multivessel PCI performed in 2018 a total of 5 stents follow-up cath October 2019 patent stents but has had a rapid acceleration in anginal pattern recently I think she is best served by referral to angiography she is aware of the risk options and benefits and elects to do it we will continue her on her oral nitrate beta-blocker and Catapres for uncontrolled hypertension along with lipid-lowering treatment.  She has been intolerant of ranolazine in the past.  She is not allergic and will have a steroid and Benadryl prep 2. Poorly controlled add Catapres 3. Continue current lipid-lowering treatment will request recent lipid profile from her PCP  Next appointment 1 month   Medication Adjustments/Labs and Tests Ordered: Current medicines are reviewed at length with the patient today.  Concerns regarding medicines are outlined above.  No orders of the defined types were placed in this encounter.  No orders of the defined types were placed in this encounter.    Chief Complaint  Patient presents with   Coronary Artery Disease    to establish care   Hyperlipidemia   Hypertension    History of Present Illness:    Pamela Hogan is a 84 y.o. female who is being seen today for the evaluation of CAD at the request of Moon, Amy A, NP. Chart review care everywhere [facility] cardiology 03/03/2019 notes she has multivessel CAD declined for bypass surgery due to comorbidities and had PCI in 2018.  Other problems include hypertension and dyslipidemia.   Left heart catheterization 11/16/2017 showed mild nonobstructive CAD with a 70% ramus stenosis.  Left heart catheterization performed 04/01/2017 resulted in PCI to the proximal mid and distal left anterior descending coronary artery and PCI to the right-sided PDA and posterior lateral branch with a total of 5 big Xience drug-eluting stents.  I reviewed her angiographic reports prior to the visit I could not actually see the films within Epic  11/16/2017:  Angiographic findings  Cardiac Arteries and Lesion Findings LMCA: 0%. LAD:  Lesion on Prox LAD: 10% stenosis. LCx:  Lesion on Mid CX: 40% stenosis. RCA:  Lesion on Prox RCA: 30% stenosis. Ramus:  Lesion on Ramus: 70% stenosis.  Comments:small  04/01/2017: Diagnostic Summary Pt with known severe LAD/RCA/Cir lesion. Pt is not a great candidate for CABG and refused CABG. Pt returned for multivessel stenting. Diagnostic Recommendations 1. PCI to prox, mid and distal LAD 2. PCI to R-PL and R-PDA lesion 3. Cir and Ramus do not appear to be severe, will leave these alone. Interventional Summary Successful PCI / Xience Drug Eluting Stent of the distal Left Anterior Descending Coronary Artery. Successful PCI / Xience Drug Eluting Stent of the mid Left Anterior Descending Coronary Artery. Successful PCI / Xience Drug Eluting Stent of the proximal Left Anterior Descending Coronary Artery. Successful PCI / Xience Drug Eluting Stent of the mid Posterolateral Coronary Artery. Successful PCI / Xience Drug Eluting Stent of the mid Posterior Descending Coronary Artery.  03/17/2017:  Angiographic findings  Cardiac Arteries and Lesion Findings LMCA: 0%. LAD:  Lesion on Prox LAD: 90% stenosis.  Lesion on Mid LAD: 80% stenosis. LCx: 0%.  Lesion on Prox CX: 80% stenosis. RCA:  Lesion on Prox RCA: 40% stenosis.  Lesion on 1st RPL: 90% stenosis.  Lesion on R PDA: 90% stenosis. Ramus:  Lesion on Ramus: 80% stenosis. LV  function assessed [id number]. Ejection Fraction  - Method: LV gram. EF%: 65  She is frustrated and seeks referral to my practice and is in her opinion never really proved after multivessel PCI.  She has always had exertional chest tightness she took ranolazine but it made her feel like she would die she is taking isosorbide but she had hypotension presently is not taking and now 2-3 times a week she is having exertional chest tightness pressure in the left precordium that caused her to stop and rest 5 to 10 minutes take nitroglycerin for relief the pattern is accelerated in the last month.  She is very limited in her activities because of diffuse weakness and joint pain.  When she tries to do activities like walking in a store she has to stop and rest or lean on the shopping cart and she finds her self weak and fatigued even walking outside the car.  She is not having edema orthopnea or syncope and has not had heart failure.  She is also aware of her heart beating at times but not always associated with her chest pain.  She is taking good medical therapy I reviewed the progression of her symptoms in her life disruptive and told her in my opinion with 5 cardiac stents placed 2 years ago she should undergo diagnostic coronary angiography and percutaneous intervention if appropriate.  Note she did not have PCI of the left circumflex coronary system for the ramus branch.  She has no known arrhythmia.  After meeting with the patient in the office reviewed her clear advise she undergo coronary angiography options benefits and risks detailed and she agrees and will be set up in the next few weeks.  She will reduce her aspirin from 5 grains 81 mg daily continue clopidogrel her beta-blocker resume oral nitrates and with accelerated hypertension start Catapres.  Lipid-lowering therapy with rosuvastatin and take sublingual nitroglycerin frequently.  She tells me the dose of her oral nitrate was increased she developed  hypotension and she has held it on days.  She has an array of other complaints including fatigue headache dizziness unsteady gait I told her my opinion I think it is unrelated to her cardiac problems. Past Medical History:  Diagnosis Date   Arthritis    Barrett's esophagus    Benign neoplasm of colon    Chronic gastritis    Colon polyp    DDD (degenerative disc disease)    Diverticulosis of colon (without mention of hemorrhage)    Duodenal ulcer    Gastritis    GERD (gastroesophageal reflux disease)    Hemorrhoids    Hyperlipidemia    Hypertension    MVP (mitral valve prolapse)    Pancreatitis    Stricture and stenosis of esophagus    TIA (transient ischemic attack)     Past Surgical History:  Procedure Laterality Date   ABDOMINAL HYSTERECTOMY     APPENDECTOMY     BACK SURGERY     For DDD    BREAST SURGERY     CARDIAC CATHETERIZATION  2018   5 stents placed   CHOLECYSTECTOMY  FOOT SURGERY     Right    KNEE ARTHROSCOPY     Bilateral    NECK SURGERY     TONSILLECTOMY     84 yrs old     Current Medications: Current Meds  Medication Sig   ALPRAZolam (XANAX) 0.25 MG tablet Take 0.25 mg by mouth at bedtime as needed.     cetirizine (ZYRTEC) 10 MG tablet Take 10 mg by mouth as needed for allergies.   clopidogrel (PLAVIX) 75 MG tablet Take 1 tablet by mouth once daily   co-enzyme Q-10 30 MG capsule Take 30 mg by mouth daily.     dicyclomine (BENTYL) 10 MG capsule Take 1 capsule (10 mg total) by mouth as needed.   esomeprazole (NEXIUM) 40 MG capsule Take 40 mg by mouth daily.    fluticasone (FLONASE) 50 MCG/ACT nasal spray Place 2 sprays into the nose daily.   metoprolol (TOPROL-XL) 50 MG 24 hr tablet Take 50 mg by mouth 2 (two) times daily. One tablet by mouth twice a day    nitroGLYCERIN (NITROSTAT) 0.4 MG SL tablet Place 0.4 mg under the tongue every 5 (five) minutes as needed for chest pain.   rosuvastatin (CRESTOR) 5 MG tablet  Take 5 mg by mouth daily.   [DISCONTINUED] aspirin 325 MG tablet Take 325 mg by mouth daily.     Allergies:   Iohexol   Social History   Socioeconomic History   Marital status: Widowed    Spouse name: Not on file   Number of children: Not on file   Years of education: Not on file   Highest education level: Not on file  Occupational History   Occupation: [employer]  Scientist, product/process development strain: Not on file   Food insecurity    Worry: Not on file    Inability: Not on file   Transportation needs    Medical: Not on file    Non-medical: Not on file  Tobacco Use   Smoking status: Never Smoker   Smokeless tobacco: Never Used  Substance and Sexual Activity   Alcohol use: No   Drug use: No   Sexual activity: Not on file  Lifestyle   Physical activity    Days per week: Not on file    Minutes per session: Not on file   Stress: Not on file  Relationships   Social connections    Talks on phone: Not on file    Gets together: Not on file    Attends religious service: Not on file    Active member of club or organization: Not on file    Attends meetings of clubs or organizations: Not on file    Relationship status: Not on file  Other Topics Concern   Not on file  Social History Narrative   Not on file     Family History: The patient's family history includes Breast cancer in her sister; Colon cancer in her maternal aunt; Heart disease in her brother; Irritable bowel syndrome in her mother.  ROS:   Review of Systems  Constitution: Positive for malaise/fatigue.  HENT: Positive for congestion.   Eyes: Negative.   Cardiovascular: Positive for chest pain and palpitations.  Respiratory: Negative.   Hematologic/Lymphatic: Negative.   Skin: Negative.   Musculoskeletal: Positive for joint pain.  Gastrointestinal: Positive for abdominal pain.  Genitourinary: Negative.   Neurological: Positive for dizziness and loss of balance.    Psychiatric/Behavioral: Negative.   Allergic/Immunologic: Negative.    Please see  the history of present illness.     All other systems reviewed and are negative.  EKGs/Labs/Other Studies Reviewed:    The following studies were reviewed today:   EKG:  EKG is  ordered today.  The ekg ordered today is personally reviewed and demonstrates sinus rhythm and is normal  Recent Labs: 11/10/2017 CMP is normal GFR 70 cc potassium 3.8 creatinine 0.79 liver function test normal 12/02/2017 ferritin 19 iron-binding saturation 13 hemoglobin 13.6 I am  unable to find a lipid profile in care everywhere Physical Exam:    VS:  BP (!) 184/90 (BP Location: Left Arm, Patient Position: Sitting, Cuff Size: Normal)    Pulse 75    Ht 5\' 3"  (1.6 m)    Wt 151 lb 12.8 oz (68.9 kg)    SpO2 97%    BMI 26.89 kg/m     Wt Readings from Last 3 Encounters:  06/06/19 151 lb 12.8 oz (68.9 kg)  09/05/14 151 lb (68.5 kg)  02/21/14 153 lb 4 oz (69.5 kg)     GEN: Elderly woman does not look chronically ill she is alert well nourished, well developed in no acute distress HEENT: Normal NECK: No JVD; No carotid bruits LYMPHATICS: No lymphadenopathy CARDIAC: RRR, no murmurs, rubs, gallops RESPIRATORY:  Clear to auscultation without rales, wheezing or rhonchi  ABDOMEN: Soft, non-tender, non-distended MUSCULOSKELETAL:  No edema; No deformity  SKIN: Warm and dry NEUROLOGIC:  Alert and oriented x 3 PSYCHIATRIC:  Normal affect     Signed, Shirlee More, MD  06/06/2019 3:33 PM    [facility]

## 2019-06-03 NOTE — H&P (View-Only) (Signed)
Cardiology Office Note:    Date:  06/06/2019   ID:  Pamela Hogan, DOB [birth year], MRN [id number]  PCP:  Lowella Dandy, NP  Cardiologist:  Shirlee More, MD   Referring MD: Lowella Dandy, NP  ASSESSMENT:    1. Coronary artery disease of native artery of native heart with stable angina pectoris ([place])   2. Essential hypertension   3. Mixed hyperlipidemia    PLAN:    In order of problems listed above:  1. Complex case she had multivessel PCI performed in 2018 a total of 5 stents follow-up cath October 2019 patent stents but has had a rapid acceleration in anginal pattern recently I think she is best served by referral to angiography she is aware of the risk options and benefits and elects to do it we will continue her on her oral nitrate beta-blocker and Catapres for uncontrolled hypertension along with lipid-lowering treatment.  She has been intolerant of ranolazine in the past.  She is not allergic and will have a steroid and Benadryl prep 2. Poorly controlled add Catapres 3. Continue current lipid-lowering treatment will request recent lipid profile from her PCP  Next appointment 1 month   Medication Adjustments/Labs and Tests Ordered: Current medicines are reviewed at length with the patient today.  Concerns regarding medicines are outlined above.  No orders of the defined types were placed in this encounter.  No orders of the defined types were placed in this encounter.    Chief Complaint  Patient presents with   Coronary Artery Disease    to establish care   Hyperlipidemia   Hypertension    History of Present Illness:    Pamela Hogan is a 84 y.o. female who is being seen today for the evaluation of CAD at the request of Moon, Amy A, NP. Chart review care everywhere [facility] cardiology 03/03/2019 notes she has multivessel CAD declined for bypass surgery due to comorbidities and had PCI in 2018.  Other problems include hypertension and dyslipidemia.   Left heart catheterization 11/16/2017 showed mild nonobstructive CAD with a 70% ramus stenosis.  Left heart catheterization performed 04/01/2017 resulted in PCI to the proximal mid and distal left anterior descending coronary artery and PCI to the right-sided PDA and posterior lateral branch with a total of 5 big Xience drug-eluting stents.  I reviewed her angiographic reports prior to the visit I could not actually see the films within Epic  11/16/2017:  Angiographic findings  Cardiac Arteries and Lesion Findings LMCA: 0%. LAD:  Lesion on Prox LAD: 10% stenosis. LCx:  Lesion on Mid CX: 40% stenosis. RCA:  Lesion on Prox RCA: 30% stenosis. Ramus:  Lesion on Ramus: 70% stenosis.  Comments:small  04/01/2017: Diagnostic Summary Pt with known severe LAD/RCA/Cir lesion. Pt is not a great candidate for CABG and refused CABG. Pt returned for multivessel stenting. Diagnostic Recommendations 1. PCI to prox, mid and distal LAD 2. PCI to R-PL and R-PDA lesion 3. Cir and Ramus do not appear to be severe, will leave these alone. Interventional Summary Successful PCI / Xience Drug Eluting Stent of the distal Left Anterior Descending Coronary Artery. Successful PCI / Xience Drug Eluting Stent of the mid Left Anterior Descending Coronary Artery. Successful PCI / Xience Drug Eluting Stent of the proximal Left Anterior Descending Coronary Artery. Successful PCI / Xience Drug Eluting Stent of the mid Posterolateral Coronary Artery. Successful PCI / Xience Drug Eluting Stent of the mid Posterior Descending Coronary Artery.  03/17/2017:  Angiographic findings  Cardiac Arteries and Lesion Findings LMCA: 0%. LAD:  Lesion on Prox LAD: 90% stenosis.  Lesion on Mid LAD: 80% stenosis. LCx: 0%.  Lesion on Prox CX: 80% stenosis. RCA:  Lesion on Prox RCA: 40% stenosis.  Lesion on 1st RPL: 90% stenosis.  Lesion on R PDA: 90% stenosis. Ramus:  Lesion on Ramus: 80% stenosis. LV  function assessed [id number]. Ejection Fraction  - Method: LV gram. EF%: 65  She is frustrated and seeks referral to my practice and is in her opinion never really proved after multivessel PCI.  She has always had exertional chest tightness she took ranolazine but it made her feel like she would die she is taking isosorbide but she had hypotension presently is not taking and now 2-3 times a week she is having exertional chest tightness pressure in the left precordium that caused her to stop and rest 5 to 10 minutes take nitroglycerin for relief the pattern is accelerated in the last month.  She is very limited in her activities because of diffuse weakness and joint pain.  When she tries to do activities like walking in a store she has to stop and rest or lean on the shopping cart and she finds her self weak and fatigued even walking outside the car.  She is not having edema orthopnea or syncope and has not had heart failure.  She is also aware of her heart beating at times but not always associated with her chest pain.  She is taking good medical therapy I reviewed the progression of her symptoms in her life disruptive and told her in my opinion with 5 cardiac stents placed 2 years ago she should undergo diagnostic coronary angiography and percutaneous intervention if appropriate.  Note she did not have PCI of the left circumflex coronary system for the ramus branch.  She has no known arrhythmia.  After meeting with the patient in the office reviewed her clear advise she undergo coronary angiography options benefits and risks detailed and she agrees and will be set up in the next few weeks.  She will reduce her aspirin from 5 grains 81 mg daily continue clopidogrel her beta-blocker resume oral nitrates and with accelerated hypertension start Catapres.  Lipid-lowering therapy with rosuvastatin and take sublingual nitroglycerin frequently.  She tells me the dose of her oral nitrate was increased she developed  hypotension and she has held it on days.  She has an array of other complaints including fatigue headache dizziness unsteady gait I told her my opinion I think it is unrelated to her cardiac problems. Past Medical History:  Diagnosis Date   Arthritis    Barrett's esophagus    Benign neoplasm of colon    Chronic gastritis    Colon polyp    DDD (degenerative disc disease)    Diverticulosis of colon (without mention of hemorrhage)    Duodenal ulcer    Gastritis    GERD (gastroesophageal reflux disease)    Hemorrhoids    Hyperlipidemia    Hypertension    MVP (mitral valve prolapse)    Pancreatitis    Stricture and stenosis of esophagus    TIA (transient ischemic attack)     Past Surgical History:  Procedure Laterality Date   ABDOMINAL HYSTERECTOMY     APPENDECTOMY     BACK SURGERY     For DDD    BREAST SURGERY     CARDIAC CATHETERIZATION  2018   5 stents placed   CHOLECYSTECTOMY  FOOT SURGERY     Right    KNEE ARTHROSCOPY     Bilateral    NECK SURGERY     TONSILLECTOMY     84 yrs old     Current Medications: Current Meds  Medication Sig   ALPRAZolam (XANAX) 0.25 MG tablet Take 0.25 mg by mouth at bedtime as needed.     cetirizine (ZYRTEC) 10 MG tablet Take 10 mg by mouth as needed for allergies.   clopidogrel (PLAVIX) 75 MG tablet Take 1 tablet by mouth once daily   co-enzyme Q-10 30 MG capsule Take 30 mg by mouth daily.     dicyclomine (BENTYL) 10 MG capsule Take 1 capsule (10 mg total) by mouth as needed.   esomeprazole (NEXIUM) 40 MG capsule Take 40 mg by mouth daily.    fluticasone (FLONASE) 50 MCG/ACT nasal spray Place 2 sprays into the nose daily.   metoprolol (TOPROL-XL) 50 MG 24 hr tablet Take 50 mg by mouth 2 (two) times daily. One tablet by mouth twice a day    nitroGLYCERIN (NITROSTAT) 0.4 MG SL tablet Place 0.4 mg under the tongue every 5 (five) minutes as needed for chest pain.   rosuvastatin (CRESTOR) 5 MG tablet  Take 5 mg by mouth daily.   [DISCONTINUED] aspirin 325 MG tablet Take 325 mg by mouth daily.     Allergies:   Iohexol   Social History   Socioeconomic History   Marital status: Widowed    Spouse name: Not on file   Number of children: Not on file   Years of education: Not on file   Highest education level: Not on file  Occupational History   Occupation: [employer]  Scientist, product/process development strain: Not on file   Food insecurity    Worry: Not on file    Inability: Not on file   Transportation needs    Medical: Not on file    Non-medical: Not on file  Tobacco Use   Smoking status: Never Smoker   Smokeless tobacco: Never Used  Substance and Sexual Activity   Alcohol use: No   Drug use: No   Sexual activity: Not on file  Lifestyle   Physical activity    Days per week: Not on file    Minutes per session: Not on file   Stress: Not on file  Relationships   Social connections    Talks on phone: Not on file    Gets together: Not on file    Attends religious service: Not on file    Active member of club or organization: Not on file    Attends meetings of clubs or organizations: Not on file    Relationship status: Not on file  Other Topics Concern   Not on file  Social History Narrative   Not on file     Family History: The patient's family history includes Breast cancer in her sister; Colon cancer in her maternal aunt; Heart disease in her brother; Irritable bowel syndrome in her mother.  ROS:   Review of Systems  Constitution: Positive for malaise/fatigue.  HENT: Positive for congestion.   Eyes: Negative.   Cardiovascular: Positive for chest pain and palpitations.  Respiratory: Negative.   Hematologic/Lymphatic: Negative.   Skin: Negative.   Musculoskeletal: Positive for joint pain.  Gastrointestinal: Positive for abdominal pain.  Genitourinary: Negative.   Neurological: Positive for dizziness and loss of balance.    Psychiatric/Behavioral: Negative.   Allergic/Immunologic: Negative.    Please see  the history of present illness.     All other systems reviewed and are negative.  EKGs/Labs/Other Studies Reviewed:    The following studies were reviewed today:   EKG:  EKG is  ordered today.  The ekg ordered today is personally reviewed and demonstrates sinus rhythm and is normal  Recent Labs: 11/10/2017 CMP is normal GFR 70 cc potassium 3.8 creatinine 0.79 liver function test normal 12/02/2017 ferritin 19 iron-binding saturation 13 hemoglobin 13.6 I am  unable to find a lipid profile in care everywhere Physical Exam:    VS:  BP (!) 184/90 (BP Location: Left Arm, Patient Position: Sitting, Cuff Size: Normal)    Pulse 75    Ht 5\' 3"  (1.6 m)    Wt 151 lb 12.8 oz (68.9 kg)    SpO2 97%    BMI 26.89 kg/m     Wt Readings from Last 3 Encounters:  06/06/19 151 lb 12.8 oz (68.9 kg)  09/05/14 151 lb (68.5 kg)  02/21/14 153 lb 4 oz (69.5 kg)     GEN: Elderly woman does not look chronically ill she is alert well nourished, well developed in no acute distress HEENT: Normal NECK: No JVD; No carotid bruits LYMPHATICS: No lymphadenopathy CARDIAC: RRR, no murmurs, rubs, gallops RESPIRATORY:  Clear to auscultation without rales, wheezing or rhonchi  ABDOMEN: Soft, non-tender, non-distended MUSCULOSKELETAL:  No edema; No deformity  SKIN: Warm and dry NEUROLOGIC:  Alert and oriented x 3 PSYCHIATRIC:  Normal affect     Signed, Shirlee More, MD  06/06/2019 3:33 PM    [facility]

## 2019-06-06 ENCOUNTER — Other Ambulatory Visit: Payer: Self-pay

## 2019-06-06 ENCOUNTER — Ambulatory Visit (INDEPENDENT_AMBULATORY_CARE_PROVIDER_SITE_OTHER): Payer: PPO | Admitting: Cardiology

## 2019-06-06 ENCOUNTER — Encounter: Payer: Self-pay | Admitting: Cardiology

## 2019-06-06 VITALS — BP 184/90 | HR 75 | Ht 63.0 in | Wt 151.8 lb

## 2019-06-06 DIAGNOSIS — I209 Angina pectoris, unspecified: Secondary | ICD-10-CM | POA: Diagnosis not present

## 2019-06-06 DIAGNOSIS — I10 Essential (primary) hypertension: Secondary | ICD-10-CM | POA: Diagnosis not present

## 2019-06-06 DIAGNOSIS — I25118 Atherosclerotic heart disease of native coronary artery with other forms of angina pectoris: Secondary | ICD-10-CM | POA: Diagnosis not present

## 2019-06-06 DIAGNOSIS — E782 Mixed hyperlipidemia: Secondary | ICD-10-CM

## 2019-06-06 DIAGNOSIS — R0789 Other chest pain: Secondary | ICD-10-CM | POA: Diagnosis not present

## 2019-06-06 MED ORDER — ISOSORBIDE MONONITRATE ER 30 MG PO TB24: 30.0000 mg | ORAL_TABLET | Freq: Every day | ORAL | 3 refills | Status: DC

## 2019-06-06 MED ORDER — CLONIDINE HCL 0.1 MG PO TABS: 0.1000 mg | ORAL_TABLET | Freq: Every day | ORAL | 3 refills | Status: DC

## 2019-06-06 MED ORDER — ASPIRIN EC 81 MG PO TBEC: 81.0000 mg | DELAYED_RELEASE_TABLET | Freq: Every day | ORAL | 3 refills | Status: DC

## 2019-06-06 MED ORDER — DIPHENHYDRAMINE HCL 50 MG PO CAPS: ORAL_CAPSULE | ORAL | 0 refills | Status: DC

## 2019-06-06 NOTE — Patient Instructions (Addendum)
Medication Instructions:  Your physician has recommended you make the following change in your medication:  DECREASE aspirin 81 mg: Take 1 tablet daily  START clonidine (catapres) 0.1 mg: Take 1 tablet daily at bedtime  *If you need a refill on your cardiac medications before your next appointment, please call your pharmacy*  Lab Work: Your physician recommends that you return for lab work today: CBC, BMP.   If you have labs (blood work) drawn today and your tests are completely normal, you will receive your results only by:  [place] (if you have MyChart) OR  A paper copy in the mail If you have any lab test that is abnormal or we need to change your treatment, we will call you to review the results.  Testing/Procedures: You had an EKG today.   You have been scheduled for pre-procedural drive-thru COVID testing at [facility] on Saturday, 06/10/2019, at 12:05 pm. Please go to [place], [place] and arrive 15 minutes early. Do NOT go to the tent or the tent line. Go to the building overhang for testing. Self-isolate afterwards until procedure is completed.   A chest x-ray takes a picture of the organs and structures inside the chest, including the heart, lungs, and blood vessels. This test can show several things, including, whether the heart is enlarges; whether fluid is building up in the lungs; and whether pacemaker / defibrillator leads are still in place.     [facility] [place] [address] Dept: [phone] Loc: [phone]  Pamela Hogan  06/06/2019  You are scheduled for a Cardiac Catheterization on Tuesday, December 15 with Dr. Glenetta Hew.  1. Please arrive at the [facility] (Main Entrance A) at [facility]: [address] [place], [place] [address] at 8:30 AM (This time is two hours before your procedure to ensure your  preparation). Free valet parking service is available.   Special note: Every effort is made to have your procedure done on time. Please understand that emergencies sometimes delay scheduled procedures.  2. Diet: Do not eat solid foods after midnight.  The patient may have clear liquids until 5am upon the day of the procedure.  3. Labs: None needed.   4. Medication instructions in preparation for your procedure:   Contrast Allergy: Yes, Please take Prednisone 50mg  by mouth at: Thirteen hours prior to cath: 9:30 pm Seven hours prior to cath 3:30 am And prior to leaving home please take last dose of Prednisone 50mg  and Benadryl 50mg  by mouth.    On the morning of your procedure, take your Plavix/Clopidogrel, aspirin, and any morning medicines NOT listed above.  You may use sips of water.  5. Plan for one night stay--bring personal belongings. 6. Bring a current list of your medications and current insurance cards. 7. You MUST have a responsible person to drive you home. 8. Someone MUST be with you the first 24 hours after you arrive home or your discharge will be delayed. 9. Please wear clothes that are easy to get on and off and wear slip-on shoes.  Thank you for allowing [place] to care for you!   -- [place]   Follow-Up: At [facility], you and your health needs are our priority.  As part of our continuing mission to provide you with exceptional heart care, we have created designated Provider Care Teams.  These Care Teams include  your primary Cardiologist (physician) and Advanced Practice Providers (APPs -  Physician Assistants and Nurse Practitioners) who all work together to provide you with the care you need, when you need it.  Your next appointment:   1 month(s)  The format for your next appointment:   In Person  Provider:   Shirlee More, MD

## 2019-06-07 LAB — BASIC METABOLIC PANEL
BUN/Creatinine Ratio: 17 (ref 12–28)
BUN: 11 mg/dL (ref 8–27)
CO2: 25 mmol/L (ref 20–29)
Calcium: 10.2 mg/dL (ref 8.7–10.3)
Chloride: 102 mmol/L (ref 96–106)
Creatinine, Ser: 0.65 mg/dL (ref 0.57–1.00)
GFR calc Af Amer: 96 mL/min/{1.73_m2} (ref >59)
GFR calc non Af Amer: 83 mL/min/{1.73_m2} (ref >59)
Glucose: 88 mg/dL (ref 65–99)
Potassium: 3.8 mmol/L (ref 3.5–5.2)
Sodium: 142 mmol/L (ref 134–144)

## 2019-06-07 LAB — CBC
Hematocrit: 39.8 % (ref 34.0–46.6)
Hemoglobin: 13.4 g/dL (ref 11.1–15.9)
MCH: 29.6 pg (ref 26.6–33.0)
MCHC: 33.7 g/dL (ref 31.5–35.7)
MCV: 88 fL (ref 79–97)
Platelets: 306 10*3/uL (ref 150–450)
RBC: 4.52 x10E6/uL (ref 3.77–5.28)
RDW: 12 % (ref 11.7–15.4)
WBC: 5.9 10*3/uL (ref 3.4–10.8)

## 2019-06-08 ENCOUNTER — Telehealth: Payer: Self-pay | Admitting: Cardiology

## 2019-06-08 NOTE — Telephone Encounter (Signed)
PT called to report that she has not received the prednisone pre med for cath from the pharmacy.. she says they have her benadryl but not her prednisone.. will forward to Dr. Bettina Gavia nurse for review.

## 2019-06-08 NOTE — Telephone Encounter (Signed)
Patient would like to know if she is to take prednisone prior to cath. If so, she needs a script

## 2019-06-09 MED ORDER — PREDNISONE 50 MG PO TABS: ORAL_TABLET | ORAL | 0 refills | Status: DC

## 2019-06-09 NOTE — Telephone Encounter (Signed)
Prescription for prednisone 50 mg has been sent to the [facility] in [place] as requested. Called patient to make her aware and reviewed instructions regarding medication administration. Patient verbalized understanding and is agreeable to plan. No further questions.

## 2019-06-09 NOTE — Addendum Note (Signed)
Addended by: Austin Miles on: 06/09/2019 09:14 AM   Modules accepted: Orders

## 2019-06-10 ENCOUNTER — Other Ambulatory Visit (HOSPITAL_COMMUNITY)
Admission: RE | Admit: 2019-06-10 | Discharge: 2019-06-10 | Disposition: A | Discharge status: Home / Self Care | Payer: PPO | Source: Ambulatory Visit | Attending: Cardiology | Admitting: Cardiology

## 2019-06-10 DIAGNOSIS — Z20828 Contact with and (suspected) exposure to other viral communicable diseases: Secondary | ICD-10-CM | POA: Diagnosis not present

## 2019-06-10 DIAGNOSIS — Z01812 Encounter for preprocedural laboratory examination: Secondary | ICD-10-CM | POA: Insufficient documentation

## 2019-06-10 LAB — SARS CORONAVIRUS 2 (TAT 6-24 HRS): SARS Coronavirus 2: NEGATIVE

## 2019-06-11 ENCOUNTER — Encounter (HOSPITAL_COMMUNITY): Payer: Self-pay | Admitting: Cardiology

## 2019-06-12 ENCOUNTER — Telehealth: Payer: Self-pay | Admitting: *Deleted

## 2019-06-12 NOTE — Telephone Encounter (Addendum)
Pt contacted pre-catheterization scheduled at [facility] for: June 13, 2019 10:30 AM Verified arrival time and place: [facility]) at: 8:30 AM   No solid food after midnight prior to cath, clear liquids until 5 AM day of procedure.  Contrast allergy: yes-13 hour prednisone and benadryl prep reviewed with patient. Prednisone 50 mg 06/12/19 9:30 PM Prednisone 50 mg 06/13/19 3:30 AM Prednisone 50 mg and Benadryl 50 mg -AM of procedure just prior to leaving for hospital Pt advised not to drive to hospital.    AM meds can be  taken pre-cath with sip of water including: ASA 81 mg (pt takes ASA 325 mg) Plavix 75 mg Prednisone 50 mg Benadryl 50 mg  Confirmed patient has responsible adult to drive home post procedure and observe 24 hours after arriving home: yes  Currently, due to Covid-19 pandemic, only one support person will be allowed with patient. Must be the same support person for that patient's entire stay, will be screened and required to wear a mask. They will be asked to wait in the waiting room for the duration of the patient's stay.  Patients are required to wear a mask when they enter the hospital.      COVID-19 Pre-Screening Questions:  . In the past 7 to 10 days have you had a cough,  shortness of breath, headache, congestion, fever (100 or greater) body aches, chills, sore throat, or sudden loss of taste or sense of smell? Throat issues/headache-not new in past 7-10 days . Have you been around anyone with known Covid 19? no . Have you been around anyone who is awaiting Covid 19 test results in the past 7 to 10 days? no . Have you been around anyone who has been exposed to Covid 19, or has mentioned symptoms of Covid 19 within the past 7 to 10 days? no   I reviewed procedure/mask/visitor instructions, Covid-19 screening questions with patient, she verbalized understanding, thanked me for call.

## 2019-06-13 ENCOUNTER — Encounter (HOSPITAL_COMMUNITY)
Admission: RE | Disposition: A | Discharge status: Home / Self Care | Payer: Self-pay | Source: Home / Self Care | Attending: Cardiology

## 2019-06-13 ENCOUNTER — Other Ambulatory Visit: Payer: Self-pay

## 2019-06-13 ENCOUNTER — Telehealth: Payer: Self-pay | Admitting: Cardiology

## 2019-06-13 ENCOUNTER — Observation Stay (HOSPITAL_COMMUNITY)
Admission: RE | Admit: 2019-06-13 | Discharge: 2019-06-14 | Disposition: A | Discharge status: Home / Self Care | Payer: PPO | Attending: Cardiology | Admitting: Cardiology

## 2019-06-13 DIAGNOSIS — K219 Gastro-esophageal reflux disease without esophagitis: Secondary | ICD-10-CM | POA: Diagnosis not present

## 2019-06-13 DIAGNOSIS — Z95820 Peripheral vascular angioplasty status with implants and grafts: Secondary | ICD-10-CM

## 2019-06-13 DIAGNOSIS — I341 Nonrheumatic mitral (valve) prolapse: Secondary | ICD-10-CM | POA: Diagnosis not present

## 2019-06-13 DIAGNOSIS — I25110 Atherosclerotic heart disease of native coronary artery with unstable angina pectoris: Secondary | ICD-10-CM | POA: Diagnosis not present

## 2019-06-13 DIAGNOSIS — Z79899 Other long term (current) drug therapy: Secondary | ICD-10-CM | POA: Diagnosis not present

## 2019-06-13 DIAGNOSIS — S40029A Contusion of unspecified upper arm, initial encounter: Secondary | ICD-10-CM | POA: Diagnosis present

## 2019-06-13 DIAGNOSIS — Z7902 Long term (current) use of antithrombotics/antiplatelets: Secondary | ICD-10-CM | POA: Diagnosis not present

## 2019-06-13 DIAGNOSIS — I10 Essential (primary) hypertension: Secondary | ICD-10-CM | POA: Insufficient documentation

## 2019-06-13 DIAGNOSIS — I200 Unstable angina: Secondary | ICD-10-CM | POA: Diagnosis present

## 2019-06-13 DIAGNOSIS — I25118 Atherosclerotic heart disease of native coronary artery with other forms of angina pectoris: Secondary | ICD-10-CM

## 2019-06-13 DIAGNOSIS — Z79890 Hormone replacement therapy (postmenopausal): Secondary | ICD-10-CM | POA: Diagnosis not present

## 2019-06-13 DIAGNOSIS — E782 Mixed hyperlipidemia: Secondary | ICD-10-CM | POA: Diagnosis not present

## 2019-06-13 DIAGNOSIS — I2510 Atherosclerotic heart disease of native coronary artery without angina pectoris: Secondary | ICD-10-CM | POA: Diagnosis present

## 2019-06-13 DIAGNOSIS — Z8673 Personal history of transient ischemic attack (TIA), and cerebral infarction without residual deficits: Secondary | ICD-10-CM | POA: Insufficient documentation

## 2019-06-13 HISTORY — DX: Unstable angina: I20.0

## 2019-06-13 HISTORY — PX: CORONARY STENT INTERVENTION: CATH118234

## 2019-06-13 HISTORY — DX: Contusion of unspecified upper arm, initial encounter: S40.029A

## 2019-06-13 HISTORY — PX: LEFT HEART CATH AND CORONARY ANGIOGRAPHY: CATH118249

## 2019-06-13 LAB — POCT ACTIVATED CLOTTING TIME
Activated Clotting Time: 246 seconds
Activated Clotting Time: 279 seconds
Activated Clotting Time: 285 seconds

## 2019-06-13 SURGERY — LEFT HEART CATH AND CORONARY ANGIOGRAPHY
Anesthesia: LOCAL

## 2019-06-13 MED ORDER — HYDRALAZINE HCL 20 MG/ML IJ SOLN: 10.0000 mg | INTRAMUSCULAR | Status: AC | PRN

## 2019-06-13 MED ORDER — ROSUVASTATIN CALCIUM 5 MG PO TABS
5.0000 mg | ORAL_TABLET | Freq: Every day | ORAL | Status: DC
  Administered 2019-06-14: 5 mg via ORAL
  Filled 2019-06-13: qty 1

## 2019-06-13 MED ORDER — PANTOPRAZOLE SODIUM 40 MG PO TBEC: 40.0000 mg | DELAYED_RELEASE_TABLET | Freq: Every day | ORAL | 1 refills | Status: DC

## 2019-06-13 MED ORDER — SODIUM CHLORIDE 0.9 % WEIGHT BASED INFUSION: 1.0000 mL/kg/h | INTRAVENOUS | Status: DC

## 2019-06-13 MED ORDER — LEVOTHYROXINE SODIUM 50 MCG PO TABS
50.0000 ug | ORAL_TABLET | Freq: Every day | ORAL | Status: DC
  Administered 2019-06-14: 50 ug via ORAL
  Filled 2019-06-13: qty 1

## 2019-06-13 MED ORDER — LABETALOL HCL 5 MG/ML IV SOLN
INTRAVENOUS | Status: DC | PRN
  Administered 2019-06-13: 10 mg via INTRAVENOUS

## 2019-06-13 MED ORDER — SODIUM CHLORIDE 0.9 % WEIGHT BASED INFUSION
3.0000 mL/kg/h | INTRAVENOUS | Status: DC
  Administered 2019-06-13: 3 mL/kg/h via INTRAVENOUS

## 2019-06-13 MED ORDER — ACETAMINOPHEN-CODEINE #3 300-30 MG PO TABS
1.0000 | ORAL_TABLET | Freq: Four times a day (QID) | ORAL | Status: DC | PRN
  Administered 2019-06-13 – 2019-06-14 (×3): 1 via ORAL
  Filled 2019-06-13 (×3): qty 1

## 2019-06-13 MED ORDER — ONDANSETRON HCL 4 MG/2ML IJ SOLN: 4.0000 mg | Freq: Four times a day (QID) | INTRAMUSCULAR | Status: DC | PRN

## 2019-06-13 MED ORDER — SODIUM CHLORIDE 0.9 % IV SOLN: 250.0000 mL | INTRAVENOUS | Status: DC | PRN

## 2019-06-13 MED ORDER — LABETALOL HCL 5 MG/ML IV SOLN
10.0000 mg | INTRAVENOUS | Status: AC | PRN
  Administered 2019-06-13: 10 mg via INTRAVENOUS

## 2019-06-13 MED ORDER — COQ-10 100 MG PO CAPS: 100.0000 mg | ORAL_CAPSULE | Freq: Every day | ORAL | Status: DC

## 2019-06-13 MED ORDER — VERAPAMIL HCL 2.5 MG/ML IV SOLN
INTRAVENOUS | Status: DC | PRN
  Administered 2019-06-13: 10:00:00 10 mL via INTRA_ARTERIAL

## 2019-06-13 MED ORDER — HEPARIN SODIUM (PORCINE) 1000 UNIT/ML IJ SOLN
INTRAMUSCULAR | Status: AC
  Filled 2019-06-13: qty 1

## 2019-06-13 MED ORDER — ASPIRIN 81 MG PO CHEW: 81.0000 mg | CHEWABLE_TABLET | ORAL | Status: DC

## 2019-06-13 MED ORDER — SODIUM CHLORIDE 0.9% FLUSH: 3.0000 mL | INTRAVENOUS | Status: DC | PRN

## 2019-06-13 MED ORDER — SODIUM CHLORIDE 0.9 % IV SOLN: INTRAVENOUS | Status: AC

## 2019-06-13 MED ORDER — METOPROLOL SUCCINATE ER 50 MG PO TB24
50.0000 mg | ORAL_TABLET | Freq: Two times a day (BID) | ORAL | Status: DC
  Administered 2019-06-13 – 2019-06-14 (×2): 50 mg via ORAL
  Filled 2019-06-13 (×2): qty 1

## 2019-06-13 MED ORDER — MIDAZOLAM HCL 2 MG/2ML IJ SOLN
INTRAMUSCULAR | Status: AC
  Filled 2019-06-13: qty 2

## 2019-06-13 MED ORDER — PANTOPRAZOLE SODIUM 40 MG PO TBEC
40.0000 mg | DELAYED_RELEASE_TABLET | Freq: Every day | ORAL | Status: DC
  Administered 2019-06-14: 40 mg via ORAL
  Filled 2019-06-13 (×2): qty 1

## 2019-06-13 MED ORDER — ACETAMINOPHEN-CODEINE #3 300-30 MG PO TABS
ORAL_TABLET | ORAL | Status: AC
  Filled 2019-06-13: qty 1

## 2019-06-13 MED ORDER — LIDOCAINE HCL (PF) 1 % IJ SOLN
INTRAMUSCULAR | Status: AC
  Filled 2019-06-13: qty 30

## 2019-06-13 MED ORDER — FENTANYL CITRATE (PF) 100 MCG/2ML IJ SOLN
INTRAMUSCULAR | Status: DC | PRN
  Administered 2019-06-13 (×2): 25 ug via INTRAVENOUS

## 2019-06-13 MED ORDER — ASPIRIN EC 81 MG PO TBEC
81.0000 mg | DELAYED_RELEASE_TABLET | Freq: Every day | ORAL | Status: DC
  Administered 2019-06-13: 81 mg via ORAL
  Filled 2019-06-13: qty 1

## 2019-06-13 MED ORDER — FENTANYL CITRATE (PF) 100 MCG/2ML IJ SOLN
INTRAMUSCULAR | Status: AC
  Filled 2019-06-13: qty 2

## 2019-06-13 MED ORDER — LABETALOL HCL 5 MG/ML IV SOLN
INTRAVENOUS | Status: AC
  Filled 2019-06-13: qty 4

## 2019-06-13 MED ORDER — NITROGLYCERIN 1 MG/10 ML FOR IR/CATH LAB
INTRA_ARTERIAL | Status: AC
  Filled 2019-06-13: qty 10

## 2019-06-13 MED ORDER — NITROGLYCERIN 0.4 MG SL SUBL: 0.4000 mg | SUBLINGUAL_TABLET | SUBLINGUAL | Status: DC | PRN

## 2019-06-13 MED ORDER — ACETAMINOPHEN 325 MG PO TABS: 650.0000 mg | ORAL_TABLET | ORAL | Status: DC | PRN

## 2019-06-13 MED ORDER — SODIUM CHLORIDE 0.9% FLUSH: 3.0000 mL | Freq: Two times a day (BID) | INTRAVENOUS | Status: DC

## 2019-06-13 MED ORDER — ACETAMINOPHEN 325 MG PO TABS: 650.0000 mg | ORAL_TABLET | Freq: Three times a day (TID) | ORAL | Status: DC | PRN

## 2019-06-13 MED ORDER — MIDAZOLAM HCL 2 MG/2ML IJ SOLN
INTRAMUSCULAR | Status: DC | PRN
  Administered 2019-06-13 (×2): 1 mg via INTRAVENOUS

## 2019-06-13 MED ORDER — VERAPAMIL HCL 2.5 MG/ML IV SOLN
INTRAVENOUS | Status: AC
  Filled 2019-06-13: qty 2

## 2019-06-13 MED ORDER — IOHEXOL 350 MG/ML SOLN
INTRAVENOUS | Status: DC | PRN
  Administered 2019-06-13: 12:00:00 190 mL

## 2019-06-13 MED ORDER — HEPARIN (PORCINE) IN NACL 1000-0.9 UT/500ML-% IV SOLN
INTRAVENOUS | Status: AC
  Filled 2019-06-13: qty 1000

## 2019-06-13 MED ORDER — NITROGLYCERIN 1 MG/10 ML FOR IR/CATH LAB
INTRA_ARTERIAL | Status: DC | PRN
  Administered 2019-06-13: 200 ug via INTRA_ARTERIAL

## 2019-06-13 MED ORDER — CLOPIDOGREL BISULFATE 75 MG PO TABS
75.0000 mg | ORAL_TABLET | Freq: Every day | ORAL | Status: DC
  Filled 2019-06-13: qty 1

## 2019-06-13 MED ORDER — HEPARIN (PORCINE) IN NACL 1000-0.9 UT/500ML-% IV SOLN
INTRAVENOUS | Status: DC | PRN
  Administered 2019-06-13 (×2): 500 mL

## 2019-06-13 MED ORDER — LIDOCAINE HCL (PF) 1 % IJ SOLN
INTRAMUSCULAR | Status: DC | PRN
  Administered 2019-06-13: 2 mL

## 2019-06-13 MED ORDER — FLUTICASONE PROPIONATE 50 MCG/ACT NA SUSP
2.0000 | Freq: Every day | NASAL | Status: DC
  Administered 2019-06-13: 2 via NASAL
  Filled 2019-06-13: qty 16

## 2019-06-13 MED ORDER — HEPARIN SODIUM (PORCINE) 1000 UNIT/ML IJ SOLN
INTRAMUSCULAR | Status: DC | PRN
  Administered 2019-06-13: 2000 [IU] via INTRAVENOUS
  Administered 2019-06-13: 4000 [IU] via INTRAVENOUS
  Administered 2019-06-13 (×2): 3000 [IU] via INTRAVENOUS

## 2019-06-13 SURGICAL SUPPLY — 20 items
BALLN EMERGE MR 2.0X12 (BALLOONS) ×2
BALLOON EMERGE MR 2.0X12 (BALLOONS) IMPLANT
CATH LAUNCHER 6FR 3DRIGHT (CATHETERS) IMPLANT
CATH LAUNCHER 6FR JR4 (CATHETERS) ×1 IMPLANT
CATH OPTITORQUE TIG 4.0 5F (CATHETERS) ×1 IMPLANT
CATHETER LAUNCHER 6FR 3DRIGHT (CATHETERS) ×2
CATHETER LAUNCHER 6FR NOTO (CATHETERS) ×1 IMPLANT
DEVICE RAD COMP TR BAND LRG (VASCULAR PRODUCTS) ×1 IMPLANT
GLIDESHEATH SLEND SS 6F .021 (SHEATH) ×1 IMPLANT
GUIDEWIRE INQWIRE 1.5J.035X260 (WIRE) IMPLANT
INQWIRE 1.5J .035X260CM (WIRE) ×2
KIT ENCORE 26 ADVANTAGE (KITS) ×1 IMPLANT
KIT HEART LEFT (KITS) ×2 IMPLANT
PACK CARDIAC CATHETERIZATION (CUSTOM PROCEDURE TRAY) ×2 IMPLANT
SHEATH PROBE COVER 6X72 (BAG) ×1 IMPLANT
STENT SYNERGY DES 2.50X8 (Permanent Stent) ×1 IMPLANT
STENT SYNERGY DES 2.5X28 (Permanent Stent) ×1 IMPLANT
TRANSDUCER W/STOPCOCK (MISCELLANEOUS) ×2 IMPLANT
TUBING CIL FLEX 10 FLL-RA (TUBING) ×2 IMPLANT
WIRE ASAHI PROWATER 180CM (WIRE) ×1 IMPLANT

## 2019-06-13 NOTE — Progress Notes (Signed)
Pt son Simona Huh called and updated. Pt to be admitted to [facility]

## 2019-06-13 NOTE — Progress Notes (Signed)
    Reassessed patient cath site. Still with swelling to the forearm extending to elbow. Area is still soft but hematoma noted. RN at the bedside using BP cuff to apply pressure intermittently. Given ongoing site issues, will plan to observe overnight and continue BP cuff protocol. TR Band still in place. Will update attending regarding plans for overnight admission.   SignedReino Bellis, NP-C 06/13/2019, 3:30 PM Pager: [phone]

## 2019-06-13 NOTE — Progress Notes (Signed)
Blood pressure cuff applied to right arm due to swelling noted, pressure slowly removed.

## 2019-06-13 NOTE — Progress Notes (Addendum)
Ria Comment, [place] in to assess pt. Blood pressure cuff applied to right arm. Pressure removed slowly. [name], rn also in to assess area

## 2019-06-13 NOTE — Telephone Encounter (Signed)
error 

## 2019-06-13 NOTE — Progress Notes (Addendum)
Blood pressure cuff applied to right arm. Applied over swelling that was noted to right arm below wrist  And TRB site. Pressure slowly removed from cuff. Harriet, Rn in to assess

## 2019-06-13 NOTE — Progress Notes (Signed)
Pressure held to right arm for 15 mins. Stephane from cath lab here to hold pressure.Ria Comment, [place] here to assess site.

## 2019-06-13 NOTE — Progress Notes (Addendum)
Dr Ellyn Hack called and updated on pt c/o pain increasing to right arm. No new orders at this time. Ria Comment, [place] called to come reassess site

## 2019-06-13 NOTE — Progress Notes (Signed)
Attempted to call report staff states they need to clean a bed and assign the pt. They will call me back.

## 2019-06-13 NOTE — Progress Notes (Signed)
Held pressure to right hand and arm for 25 minutes. Hematoma in both right  wrist and arm looks much better.  Dr Ellyn Hack in and decision was made to use cold compress for 15 minutes on and off for an hour.

## 2019-06-13 NOTE — Progress Notes (Addendum)
Pt complains of pain at lower posterior forearm. Slight swelling noted at this site. Swelling  Also noted above TR. Blood pressure cuff applied to lower forearm. Pressure slowly removed. Harriet , Rn in to assess

## 2019-06-13 NOTE — Progress Notes (Signed)
Purewick applied so pt can void

## 2019-06-13 NOTE — Progress Notes (Signed)
[id number] Education completed with pt who voiced understanding. Stressed importance of plavix with stent. Reviewed NTG use, walking for ex, gave heart healthy diet, and discussed CRP 2. Will refer to [facility] CRP 2 and pt will consider. Graylon Good RN BSN 06/13/2019 2:28 PM

## 2019-06-13 NOTE — Interval H&P Note (Signed)
History and Physical Interval Note:  06/13/2019 9:51 AM  Rennis Golden  has presented today for surgery, with the diagnosis of chest pain -concerning for progressive angina in a patient with known CAD-multisite PCI.Marland Kitchen    Past Surgical History:  Procedure Laterality Date   CORONARY STENT INTERVENTION  04/01/2017   [place]: 5 Xience DES stents placed - dLAD 2.5 x 8 (2.75 mm), mLAD 2.5 x 15, pLAD 3.0 x 8; rPL1 2.5 x 12, rPDA 2.5 x 8.  Stable RI ~65%. & mCx 50% - Med Rx.  EF 65%.    LEFT HEART CATH AND CORONARY ANGIOGRAPHY  03/17/2017   [facility]: pLAD 90%, mLAD 80%, pCx 80. RI 80% (small); pRCA 40%, rPL1 90%, PDA 90% , EF 65%.  Referred for CABG -> then STAGED PCI   LEFT HEART CATH AND CORONARY ANGIOGRAPHY  11/16/2017   [place] -patent LAD, PL and PDA stents. pLAD 10%, mCx 40%, RI ~70%. EF ~65%.  Med Rx.    The various methods of treatment have been discussed with the patient and family. After consideration of risks, benefits and other options for treatment, the patient has consented to  Procedure(s): LEFT HEART CATH AND CORONARY ANGIOGRAPHY (N/A)  PERCUTANEOUS CORONARY INTERVENTION  as a surgical intervention.  The patient's history has been reviewed, patient examined, no change in status, stable for surgery.  I have reviewed the patient's chart and labs.  Questions were answered to the patient's satisfaction.    Cath Lab Visit (complete for each Cath Lab visit)  Clinical Evaluation Leading to the Procedure:   ACS: No.  Non-ACS:    Anginal Classification: CCS III  Anti-ischemic medical therapy: Minimal Therapy (1 class of medications)  Non-Invasive Test Results: No non-invasive testing performed  Prior CABG: No previous CABG   Glenetta Hew

## 2019-06-13 NOTE — Progress Notes (Signed)
Pt arrived to short stay will large hematoma. Hayley, RN holding pressure. VSS. Mendel Ryder, [place] paged

## 2019-06-13 NOTE — Progress Notes (Signed)
swelling continues to right arm below TRB swelling increased, Blood pressure cuff applied below TRB pressure removed slowly.

## 2019-06-13 NOTE — Progress Notes (Addendum)
Dr Ellyn Hack in to see pt. States that she can eat bed placement called states that 6e is waiting on a bed to be cleaned

## 2019-06-13 NOTE — Progress Notes (Signed)
Dr Ellyn Hack called and informed of swelling noted right wrist to almost elbow area. Swelling is firm to touch. Ria Comment, [place] also informed. Asked Dr and PA to come see pt. Pressure is being held by cath lab staff Hayley.

## 2019-06-13 NOTE — Progress Notes (Signed)
Report called to Mickel Baas on 6E. Son Simona Huh called and updated.

## 2019-06-14 DIAGNOSIS — Z7902 Long term (current) use of antithrombotics/antiplatelets: Secondary | ICD-10-CM | POA: Diagnosis not present

## 2019-06-14 DIAGNOSIS — Z79890 Hormone replacement therapy (postmenopausal): Secondary | ICD-10-CM | POA: Diagnosis not present

## 2019-06-14 DIAGNOSIS — Z8673 Personal history of transient ischemic attack (TIA), and cerebral infarction without residual deficits: Secondary | ICD-10-CM | POA: Diagnosis not present

## 2019-06-14 DIAGNOSIS — I341 Nonrheumatic mitral (valve) prolapse: Secondary | ICD-10-CM | POA: Diagnosis not present

## 2019-06-14 DIAGNOSIS — I25110 Atherosclerotic heart disease of native coronary artery with unstable angina pectoris: Secondary | ICD-10-CM | POA: Diagnosis not present

## 2019-06-14 DIAGNOSIS — I10 Essential (primary) hypertension: Secondary | ICD-10-CM | POA: Diagnosis not present

## 2019-06-14 DIAGNOSIS — E782 Mixed hyperlipidemia: Secondary | ICD-10-CM | POA: Diagnosis not present

## 2019-06-14 DIAGNOSIS — K219 Gastro-esophageal reflux disease without esophagitis: Secondary | ICD-10-CM | POA: Diagnosis not present

## 2019-06-14 DIAGNOSIS — Z79899 Other long term (current) drug therapy: Secondary | ICD-10-CM | POA: Diagnosis not present

## 2019-06-14 LAB — BASIC METABOLIC PANEL
Anion gap: 10 (ref 5–15)
BUN: 8 mg/dL (ref 8–23)
CO2: 26 mmol/L (ref 22–32)
Calcium: 9.2 mg/dL (ref 8.9–10.3)
Chloride: 107 mmol/L (ref 98–111)
Creatinine, Ser: 0.68 mg/dL (ref 0.44–1.00)
GFR calc Af Amer: >60 mL/min (ref >60)
GFR calc non Af Amer: >60 mL/min (ref >60)
Glucose, Bld: 107 mg/dL — ABNORMAL HIGH (ref 70–99)
Potassium: 3.6 mmol/L (ref 3.5–5.1)
Sodium: 143 mmol/L (ref 135–145)

## 2019-06-14 LAB — CBC
HCT: 35 % — ABNORMAL LOW (ref 36.0–46.0)
Hemoglobin: 11.5 g/dL — ABNORMAL LOW (ref 12.0–15.0)
MCH: 29.8 pg (ref 26.0–34.0)
MCHC: 32.9 g/dL (ref 30.0–36.0)
MCV: 90.7 fL (ref 80.0–100.0)
Platelets: 276 10*3/uL (ref 150–400)
RBC: 3.86 MIL/uL — ABNORMAL LOW (ref 3.87–5.11)
RDW: 13.1 % (ref 11.5–15.5)
WBC: 9.3 10*3/uL (ref 4.0–10.5)
nRBC: 0 % (ref 0.0–0.2)

## 2019-06-14 NOTE — Discharge Summary (Signed)
The patient has been seen in conjunction with Pamela Bellis, NP. All aspects of care have been considered and discussed. The patient has been personally interviewed, examined, and all clinical data has been reviewed.   There is ecchymosis along the right medial forearm that is mildly to moderately tender.  No palpable hematoma.  Ulnar artery is palpable.  Radial artery not so much.  Findings reviewed with the patient and son.  Asked that she use cold packs R ICE 10 minutes 3 times per day with a layer of cloth between the cold source and her skin.  Keep the arm elevated.  Agree with discharge medications as outlined.  Follow-up with Dr. Satira Mccallum as outlined.    Discharge Summary    Patient ID: Pamela Hogan,  MRN: [id number], DOB/AGE: 02-Sep-1934 84 y.o.  Admit date: 06/13/2019 Discharge date: 06/14/2019  Primary Care Provider: Laverna Peace A Primary Cardiologist: Shirlee More, MD  Discharge Diagnoses    Principal Problem:   Accelerating angina Pamela Hogan) Active Problems:   Coronary artery disease involving native coronary artery of native heart with unstable angina pectoris ([place])   Hematoma of arm   Allergies Allergies  Allergen Reactions  . Iohexol     Some type of reaction to a dye 55 years ago caused bumps on the skin  . Ranexa [Ranolazine]     Chest pain    Diagnostic Studies/Procedures    Cath: 06/13/19   RPDA-1 lesion is 60% stenosed. RPDA-2 lesion is 80% stenosed -proximal to previous stent  A drug-eluting stent was successfully placed from near ostial PDA overlapping the previous stent, using a STENT SYNERGY DES 2.5X28. Postdilated 2.8 mm  Previously placed RPDA-3 stent (Xience DES) is widely patent.  Previously placed 1st RPL-1 stent (Xience DES) is widely patent.  1st RPL-2 lesion is 90% stenosed just distal to previous stent.  A drug-eluting stent was successfully placed overlapping the previous stent distally, using a STENT SYNERGY DES 2.5X28.  Postdilated 2.8 mm  Post intervention, there is a 0% residual stenosis.  ------------------------------------------  Previously placed proximal-mid LAD overlapping stents (Xience DES) are widely patent.  Previously placed Dist LAD DES stent (Xience DES) is widely patent.  Ramus lesion is 90% stenosed. Vessels were too small for PCI. Recommend medical management.  Ost RCA to Prox RCA lesion is 55% stenosed. Mid Cx lesion is 55% stenosed.  ---------------------------------------------------  The left ventricular systolic function is normal. The left ventricular ejection fraction is 55-65% by visual estimate. LV end diastolic pressure is mildly elevated.   SUMMARY  Severe 3-4 Vessel Disease:  ? Widely patent proximal and mid LAD overlapping stents as well as distal LAD stent,  ? Widely patent RPDA stent with diffuse disease proximal most notable 60 to 80%,   Successful DES PCI of RPDA ostium to mid overlapping prior stent (Synergy DES 2.5 mm x 28 mm--2.8 mm).  ? Widely patent RPL 1 stent with focal 90% stenosis just distal to the stent edge.    Successful DES PCI of RPL 1 with Synergy DES 2.5 mm x 8 mm--2.8 mm overlapping distal to previous stent. ? 90% superior branch of small caliber ramus intermedius (medical management).   ? Otherwise moderate nonobstructive ostial RCA and mid LCx disease.  Normal LVEF and EDP.   RECOMMENDATIONS  Anticipate potential same-day discharge provided she is stable from medically.  She is already on aspirin and Plavix.  Needs continued medical management of blood pressure  Is on low-dose statin, defer to  patient's primary cardiologist -Dr. Daisy Floro, MD  Diagnostic Dominance: Right  Intervention    _____________   History of Present Illness     LE INGS is a 84 y.o. female who was seen in the office for evaluation of chest pian. Chart review care everywhere [facility] cardiology 03/03/2019 notes  indicate she has multivessel CAD declined for bypass surgery due to comorbidities and had PCI in 2018.  Other problems include hypertension and dyslipidemia.  Left heart catheterization 11/16/2017 showed mild nonobstructive CAD with a 70% ramus stenosis.  Left heart catheterization performed 04/01/2017 resulted in PCI to the proximal mid and distal left anterior descending coronary artery and PCI to the right-sided PDA and posterior lateral branch with a total of 5 big Xience drug-eluting stents.  She transferred her care to [place] from [facility] cardiology. She has always had exertional chest tightness and reported taking ranolazine but it made her feel like she would die. Had been on Imdur but she had hypotension and stopped this as well. Reported episodes about 2-3 times a week with exertional chest tightness pressure in the left precordium which had become worse over the past month.  She was very limited in her activities because of diffuse weakness and joint pain.  When she tried to do activities like walking in a store she has to stop and rest or lean on the shopping cart and she finds her self weak and fatigued even walking outside the car. After decision in the office, decision was made to sent for cardiac cath.   Hospital Course     Underwent cardiac cath noted above with PCI/DES x1 to RPDA and DES x1 to RPL with previously placed stents in the LAD widely patent. Placed on ASA/plavix with plans to continue for at least one year but likely longer given extensive stenting. Planned for same day discharge but developed a hematoma with extension down into the forearm. Required the use of BP cuff intermittent pressure. Noted to have bruising the following morning but cath site was soft.   General: Well developed, well nourished, female appearing in no acute distress. Head: Normocephalic, atraumatic.  Neck: Supple without bruits, JVD. Lungs:  Resp regular and unlabored, CTA. Heart: RRR, S1, S2, no S3,  S4, or murmur; no rub. Abdomen: Soft, non-tender, non-distended with normoactive bowel sounds. No hepatomegaly. No rebound/guarding. No obvious abdominal masses. Extremities: No clubbing, cyanosis, edema. Distal pedal pulses are 2+ bilaterally. Right ulnar cath site stable but with bruising around site and forearm. Neuro: Alert and oriented X 3. Moves all extremities spontaneously. Psych: Normal affect.  Rennis Golden was seen by Dr. Tamala Julian and determined stable for discharge home. Follow up in the office has been arranged. Medications are listed below.   _____________  Discharge Vitals Blood pressure (!) 164/65, pulse 81, temperature 98.7 F (37.1 C), temperature source Oral, resp. rate 17, height 5\' 3"  (1.6 m), weight 68.5 kg, SpO2 97 %.  Filed Weights   06/13/19 0840 06/14/19 0350  Weight: 68 kg 68.5 kg    Labs & Radiologic Studies    CBC Recent Labs    06/14/19 0428  WBC 9.3  HGB 11.5*  HCT 35.0*  MCV 90.7  PLT [id number]   Basic Metabolic Panel Recent Labs    06/14/19 0428  NA 143  K 3.6  CL 107  CO2 26  GLUCOSE 107*  BUN 8  CREATININE 0.68  CALCIUM 9.2   Liver Function Tests No results  for input(s): AST, ALT, ALKPHOS, BILITOT, PROT, ALBUMIN in the last 72 hours. No results for input(s): LIPASE, AMYLASE in the last 72 hours. Cardiac Enzymes No results for input(s): CKTOTAL, CKMB, CKMBINDEX, TROPONINI in the last 72 hours. BNP Invalid input(s): POCBNP D-Dimer No results for input(s): DDIMER in the last 72 hours. Hemoglobin A1C No results for input(s): HGBA1C in the last 72 hours. Fasting Lipid Panel No results for input(s): CHOL, HDL, LDLCALC, TRIG, CHOLHDL, LDLDIRECT in the last 72 hours. Thyroid Function Tests No results for input(s): TSH, T4TOTAL, T3FREE, THYROIDAB in the last 72 hours.  Invalid input(s): FREET3 _____________  CARDIAC CATHETERIZATION  Result Date: 06/13/2019  RPDA-1 lesion is 60% stenosed. RPDA-2 lesion is 80% stenosed -proximal to  previous stent  A drug-eluting stent was successfully placed from near ostial PDA overlapping the previous stent, using a STENT SYNERGY DES 2.5X28. Postdilated 2.8 mm  Previously placed RPDA-3 stent (Xience DES) is widely patent.  Previously placed 1st RPL-1 stent (Xience DES) is widely patent.  1st RPL-2 lesion is 90% stenosed just distal to previous stent.  A drug-eluting stent was successfully placed overlapping the previous stent distally, using a STENT SYNERGY DES 2.5X28. Postdilated 2.8 mm  Post intervention, there is a 0% residual stenosis.  ------------------------------------------  Previously placed proximal-mid LAD overlapping stents (Xience DES) are widely patent.  Previously placed Dist LAD DES stent (Xience DES) is widely patent.  Ramus lesion is 90% stenosed. Vessels were too small for PCI. Recommend medical management.  Ost RCA to Prox RCA lesion is 55% stenosed. Mid Cx lesion is 55% stenosed.  ---------------------------------------------------  The left ventricular systolic function is normal. The left ventricular ejection fraction is 55-65% by visual estimate. LV end diastolic pressure is mildly elevated.  SUMMARY  Severe 3-4 Vessel Disease:  Widely patent proximal and mid LAD overlapping stents as well as distal LAD stent,  Widely patent RPDA stent with diffuse disease proximal most notable 60 to 80%,  Successful DES PCI of RPDA ostium to mid overlapping prior stent (Synergy DES 2.5 mm x 28 mm--2.8 mm).  Widely patent RPL 1 stent with focal 90% stenosis just distal to the stent edge.   Successful DES PCI of RPL 1 with Synergy DES 2.5 mm x 8 mm--2.8 mm overlapping distal to previous stent.  90% superior branch of small caliber ramus intermedius (medical management).   Otherwise moderate nonobstructive ostial RCA and mid LCx disease.  Normal LVEF and EDP. RECOMMENDATIONS  Anticipate potential same-day discharge provided she is stable from medically.  She is already on  aspirin and Plavix.  Needs continued medical management of blood pressure  Is on low-dose statin, defer to patient's primary cardiologist -Dr. Daisy Floro, MD   Disposition   Pt is being discharged home today in good condition.  Follow-up Plans & Appointments    Follow-up Information    Tobb, Godfrey Pick, DO Follow up on 06/26/2019.   Specialty: Cardiology Why: at 8:25am for your follow up appt with Dr. Joya Gaskins partner.  Contact information: [place] [address] [phone]          Discharge Instructions    Amb Referral to Cardiac Rehabilitation   Complete by: As directed    Referring to [place] CRP 2   Diagnosis: Coronary Stents   After initial evaluation and assessments completed: Virtual Based Care may be provided alone or in conjunction with Phase 2 Cardiac Rehab based on patient barriers.: Yes       Discharge Medications  Medication List    STOP taking these medications   diphenhydrAMINE 50 MG capsule Commonly known as: BENADRYL   esomeprazole 40 MG capsule Commonly known as: [place] by: pantoprazole 40 MG tablet   predniSONE 50 MG tablet Commonly known as: DELTASONE     TAKE these medications   acetaminophen 650 MG CR tablet Commonly known as: TYLENOL Take 650 mg by mouth every 8 (eight) hours as needed for pain.   ALPRAZolam 0.5 MG tablet Commonly known as: XANAX Take 0.5 mg by mouth See admin instructions. Take 0.5 mg at night, may take a second 0.5 mg dose during the day as needed for anxiety   aluminum-magnesium hydroxide-simethicone [id number] MG/5ML Susp Commonly known as: MAALOX Take 30 mLs by mouth 3 (three) times daily as needed (indigestion).   ARTIFICIAL TEAR SOLUTION OP Place 1 drop into both eyes daily as needed (dry eyes).   aspirin EC 81 MG tablet Take 1 tablet (81 mg total) by mouth daily. What changed: when to take this   calcium carbonate 500 MG chewable tablet Commonly known as: TUMS -  dosed in mg elemental calcium Chew 1 tablet by mouth 2 (two) times daily as needed for indigestion or heartburn.   cetirizine 10 MG tablet Commonly known as: ZYRTEC Take 10 mg by mouth as needed for allergies.   cholecalciferol 25 MCG (1000 UT) tablet Commonly known as: VITAMIN D3 Take 1,000 Units by mouth 2 (two) times daily.   cloNIDine 0.1 MG tablet Commonly known as: Catapres Take 1 tablet (0.1 mg total) by mouth at bedtime.   clopidogrel 75 MG tablet Commonly known as: PLAVIX Take 75 mg by mouth at bedtime.   CoQ-10 100 MG Caps Take 100 mg by mouth at bedtime.   dicyclomine 10 MG capsule Commonly known as: BENTYL Take 1 capsule (10 mg total) by mouth as needed. What changed: reasons to take this   Flector 1.3 % Ptch Generic drug: diclofenac Place 1 patch onto the skin daily as needed (pain).   fluticasone 50 MCG/ACT nasal spray Commonly known as: FLONASE Place 2 sprays into the nose at bedtime.   isosorbide mononitrate 30 MG 24 hr tablet Commonly known as: IMDUR Take 1 tablet (30 mg total) by mouth daily.   levothyroxine 50 MCG tablet Commonly known as: SYNTHROID Take 50 mcg by mouth daily before breakfast.   metoprolol succinate 50 MG 24 hr tablet Commonly known as: TOPROL-XL Take 50 mg by mouth 2 (two) times daily.   nitroGLYCERIN 0.4 MG SL tablet Commonly known as: NITROSTAT Place 0.4 mg under the tongue every 5 (five) minutes as needed for chest pain.   pantoprazole 40 MG tablet Commonly known as: PROTONIX Take 1 tablet (40 mg total) by mouth daily. Replaces: esomeprazole 40 MG capsule   PreserVision AREDS 2 Caps Take 1 capsule by mouth 2 (two) times daily.   rosuvastatin 5 MG tablet Commonly known as: CRESTOR Take 5 mg by mouth daily.       No                               Did the patient have a percutaneous coronary intervention (stent / angioplasty)?:  Yes.     Cath/PCI Registry Performance & Quality Measures: 1. Aspirin prescribed? -  Yes 2. ADP Receptor Inhibitor (Plavix/Clopidogrel, Brilinta/Ticagrelor or Effient/Prasugrel) prescribed (includes medically managed patients)? - Yes 3. High Intensity Statin (Lipitor 40-80mg  or Crestor 20-40mg ) prescribed? - No -  intolerant 4. For EF <40%, was ACEI/ARB prescribed? - Not Applicable (EF >/= [id number]) 5. For EF <40%, Aldosterone Antagonist (Spironolactone or Eplerenone) prescribed? - Not Applicable (EF >/= [id number]) 6. Cardiac Rehab Phase II ordered (Included Medically managed Patients)? - Yes      Outstanding Labs/Studies   N/a   Duration of Discharge Encounter   Greater than 30 minutes including physician time.  Signed, Pamela Bellis NP-C 06/14/2019, 9:48 AM

## 2019-06-14 NOTE — Progress Notes (Signed)
CARDIAC REHAB PHASE I   PRE:  Rate/Rhythm: 82 SR  BP:  Supine: 154/73  Sitting:   Standing:    SaO2: 96%RA  MODE:  Ambulation: 320 ft   POST:  Rate/Rhythm: 124 ST   To 87 with rest  BP:  Supine:   Sitting: 162/84  Standing:    SaO2: 97%RA 0820-0900 Pt assisted to bathroom and then walked 320 ft on RA with asst x 1. HR up a little with walk but to 87 after sitting. Pt a little wobbly by end of walk. To recliner and set up breakfast. No tightness in chest with walk. Education completed yesterday and will send referral to [place]. Right arm up on two pillows.   Graylon Good, RN BSN  06/14/2019 8:54 AM

## 2019-06-14 NOTE — Progress Notes (Signed)
Pt's right wrist remains stable. Explained to pt if worsened to please contact cardiologist. Carroll Kinds RN

## 2019-06-15 ENCOUNTER — Telehealth: Payer: Self-pay | Admitting: Cardiology

## 2019-06-15 NOTE — Telephone Encounter (Signed)
Has questions about a procedure she was supposed to have

## 2019-06-16 NOTE — Addendum Note (Signed)
Addended by: Austin Miles on: 06/16/2019 12:37 PM   Modules accepted: Orders

## 2019-06-16 NOTE — Telephone Encounter (Signed)
Called patient who reports hypotension with taking isosorbide mononitrate 30 mg daily and metoprolol succinate 50 mg twice daily. Yesterday, patient reports her BP got down to 110/49 after taking her medicine. She was dizzy and "just didn't feel right." Today, patient did not take her isosorbide mononitrate but did take metoprolol succinate 50 mg and she has no complaints. Her BP after walking to the kitchen was 155/80 and earlier this morning before taking her medications her BP was 137/76 and her HR was 69.   Spoke with Dr. Bettina Gavia who advised patient to stop taking isosorbide mononitrate as she does not need it anymore. Patient informed and verbalized understanding. No further questions.

## 2019-06-20 DIAGNOSIS — Z961 Presence of intraocular lens: Secondary | ICD-10-CM | POA: Diagnosis not present

## 2019-06-20 DIAGNOSIS — H353131 Nonexudative age-related macular degeneration, bilateral, early dry stage: Secondary | ICD-10-CM | POA: Diagnosis not present

## 2019-06-26 ENCOUNTER — Ambulatory Visit: Payer: PPO

## 2019-06-26 ENCOUNTER — Ambulatory Visit (INDEPENDENT_AMBULATORY_CARE_PROVIDER_SITE_OTHER): Payer: PPO | Admitting: Cardiology

## 2019-06-26 ENCOUNTER — Encounter: Payer: Self-pay | Admitting: Cardiology

## 2019-06-26 ENCOUNTER — Other Ambulatory Visit: Payer: Self-pay

## 2019-06-26 VITALS — BP 180/88 | HR 74 | Ht 63.0 in | Wt 152.2 lb

## 2019-06-26 DIAGNOSIS — I10 Essential (primary) hypertension: Secondary | ICD-10-CM

## 2019-06-26 DIAGNOSIS — I25110 Atherosclerotic heart disease of native coronary artery with unstable angina pectoris: Secondary | ICD-10-CM

## 2019-06-26 DIAGNOSIS — R002 Palpitations: Secondary | ICD-10-CM | POA: Diagnosis not present

## 2019-06-26 DIAGNOSIS — E782 Mixed hyperlipidemia: Secondary | ICD-10-CM | POA: Diagnosis not present

## 2019-06-26 NOTE — Patient Instructions (Signed)
Medication Instructions:  Your physician recommends that you continue on your current medications as directed. Please refer to the Current Medication list given to you today.  *If you need a refill on your cardiac medications before your next appointment, please call your pharmacy*  Lab Work: Your physician recommends that you return for lab work in:   BEFORE NEXT VISIT: Fasting Lipid  If you have labs (blood work) drawn today and your tests are completely normal, you will receive your results only by: Marland Kitchen MyChart Message (if you have MyChart) OR . A paper copy in the mail If you have any lab test that is abnormal or we need to change your treatment, we will call you to review the results.  Testing/Procedures: A zio monitor was ordered today. It will remain on for 14 days. You will then return monitor and event diary in provided box. It takes 1-2 weeks for report to be downloaded and returned to [place]. We will call you with the results. If monitor falls off or has orange flashing light, please call [employer]io for further instructions.     Follow-Up: At [facility], you and your health needs are our priority.  As part of our continuing mission to provide you with exceptional heart care, we have created designated Provider Care Teams.  These Care Teams include your primary Cardiologist (physician) and Advanced Practice Providers (APPs -  Physician Assistants and Nurse Practitioners) who all work together to provide you with the care you need, when you need it.  Your next appointment:   3 month(s)  The format for your next appointment:   In Person  Provider:   Shirlee More, MD  Other Instructions

## 2019-06-26 NOTE — Progress Notes (Signed)
Cardiology Office Note:    Date:  06/26/2019   ID:  Pamela Hogan, DOB [birth year], MRN [id number]  PCP:  Lowella Dandy, NP  Cardiologist:  Shirlee More, MD  Electrophysiologist:  None   Referring MD: Lowella Dandy, NP   Follow-up for coronary artery disease   History of Present Illness:    Pamela Hogan is a 84 y.o. female with a hx of coronary artery disease in 2018 with multivessel who declined CABG,in 2019 she did undergo PCI to the proximal mid and distal LAD, PCI to the RPDA, and PLB.  Hypertension, hyperlipidemia.  The patient was seen by Dr. Bettina Gavia on December 8 as a new referral due to exertional chest pain.  At the time of her visitation given her history is recommended that she undergo left heart catheterization to assess progression of her coronary artery disease.   In the interim she was able to undergo left heart cath with PCI on June 13, 2019.  She tells me that the chest pain has improved and she is no longer taking the Imdur.    But now she has been experiencing significant intermittent palpitations.  She notes that this has been occurring even prior to her sensation for her chest pain.  He described these palpitations abrupt onset of fast heart rate.  She tells me they last for few minutes prior to resolution.  Nothing makes it better or worse.  She is very bothered by these palpitations as she tells me he does feel as a vibration of her heart when she is experiencing.  Denies any associated shortness of breath,   Past Medical History:  Diagnosis Date  . Arthritis   . Barrett's esophagus   . Benign neoplasm of colon   . Chronic gastritis   . Colon polyp   . DDD (degenerative disc disease)   . Diverticulosis of colon (without mention of hemorrhage)   . Duodenal ulcer   . Gastritis   . GERD (gastroesophageal reflux disease)   . Hemorrhoids   . Hyperlipidemia   . Hypertension   . MVP (mitral valve prolapse)   . Pancreatitis   . Stricture and stenosis of  esophagus   . TIA (transient ischemic attack)     Past Surgical History:  Procedure Laterality Date  . ABDOMINAL HYSTERECTOMY    . APPENDECTOMY    . BACK SURGERY     For DDD   . BREAST SURGERY    . CHOLECYSTECTOMY    . CORONARY STENT INTERVENTION  04/01/2017   [facility]: 5 Xience DES stents placed - dLAD 2.5 x 8 (2.75 mm), mLAD 2.5 x 15, pLAD 3.0 x 8; rPL1 2.5 x 12, rPDA 2.5 x 8.  Stable RI ~65%. & mCx 50% - Med Rx.  EF 65%.   . CORONARY STENT INTERVENTION N/A 06/13/2019   Procedure: CORONARY STENT INTERVENTION;  Surgeon: Leonie Man, MD;  Location: [facility];  Service: Cardiovascular;  Laterality: N/A;  . FOOT SURGERY     Right   . KNEE ARTHROSCOPY     Bilateral   . LEFT HEART CATH AND CORONARY ANGIOGRAPHY  03/17/2017   [facility]: pLAD 90%, mLAD 80%, pCx 80. RI 80% (small); pRCA 40%, rPL1 90%, PDA 90% , EF 65%.  Referred for CABG -> then STAGED PCI  . LEFT HEART CATH AND CORONARY ANGIOGRAPHY  11/16/2017   [facility] -patent LAD, PL and PDA stents. pLAD 10%, mCx 40%, RI ~70%. EF ~65%.  Med Rx.  Marland Kitchen  LEFT HEART CATH AND CORONARY ANGIOGRAPHY N/A 06/13/2019   Procedure: LEFT HEART CATH AND CORONARY ANGIOGRAPHY;  Surgeon: Leonie Man, MD;  Location: [facility];  Service: Cardiovascular;  Laterality: N/A;  . NECK SURGERY    . TONSILLECTOMY     84 yrs old     Current Medications: Current Meds  Medication Sig  . acetaminophen (TYLENOL) 650 MG CR tablet Take 650 mg by mouth every 8 (eight) hours as needed for pain.  Marland Kitchen ALPRAZolam (XANAX) 0.5 MG tablet Take 0.5 mg by mouth See admin instructions. Take 0.5 mg at night, may take a second 0.5 mg dose during the day as needed for anxiety  . aluminum-magnesium hydroxide-simethicone (MAALOX) [id number] MG/5ML SUSP Take 30 mLs by mouth 3 (three) times daily as needed (indigestion).  . ARTIFICIAL TEAR SOLUTION OP Place 1 drop into both eyes daily as needed (dry eyes).  Marland Kitchen aspirin EC 81 MG tablet Take 1 tablet (81 mg total) by mouth  daily. (Patient taking differently: Take 81 mg by mouth at bedtime. )  . calcium carbonate (TUMS - DOSED IN MG ELEMENTAL CALCIUM) 500 MG chewable tablet Chew 1 tablet by mouth 2 (two) times daily as needed for indigestion or heartburn.  . cetirizine (ZYRTEC) 10 MG tablet Take 10 mg by mouth as needed for allergies.  . cholecalciferol (VITAMIN D3) 25 MCG (1000 UT) tablet Take 1,000 Units by mouth 2 (two) times daily.  . cloNIDine (CATAPRES) 0.1 MG tablet Take 1 tablet (0.1 mg total) by mouth at bedtime.  . clopidogrel (PLAVIX) 75 MG tablet Take 75 mg by mouth at bedtime.   . Coenzyme Q10 (COQ-10) 100 MG CAPS Take 100 mg by mouth at bedtime.  . diclofenac (FLECTOR) 1.3 % PTCH Place 1 patch onto the skin daily as needed (pain).  Marland Kitchen dicyclomine (BENTYL) 10 MG capsule Take 1 capsule (10 mg total) by mouth as needed. (Patient taking differently: Take 10 mg by mouth as needed for spasms. )  . fluticasone (FLONASE) 50 MCG/ACT nasal spray Place 2 sprays into the nose at bedtime.   Marland Kitchen levothyroxine (SYNTHROID) 50 MCG tablet Take 50 mcg by mouth daily before breakfast.  . metoprolol (TOPROL-XL) 50 MG 24 hr tablet Take 50 mg by mouth 2 (two) times daily.   . Multiple Vitamins-Minerals (PRESERVISION AREDS 2) CAPS Take 1 capsule by mouth 2 (two) times daily.  . nitroGLYCERIN (NITROSTAT) 0.4 MG SL tablet Place 0.4 mg under the tongue every 5 (five) minutes as needed for chest pain.  . Omega-3 Fatty Acids (FISH OIL) 1000 MG CAPS Take 2,000 mg by mouth.  . pantoprazole (PROTONIX) 40 MG tablet Take 1 tablet (40 mg total) by mouth daily.  . rosuvastatin (CRESTOR) 5 MG tablet Take 5 mg by mouth daily.     Allergies:   Iohexol and Ranexa [ranolazine]   Social History   Socioeconomic History  . Marital status: Widowed    Spouse name: Not on file  . Number of children: Not on file  . Years of education: Not on file  . Highest education level: Not on file  Occupational History  . Occupation: [employer]  Tobacco Use   . Smoking status: Never Smoker  . Smokeless tobacco: Never Used  Substance and Sexual Activity  . Alcohol use: No  . Drug use: No  . Sexual activity: Not on file  Other Topics Concern  . Not on file  Social History Narrative  . Not on file   Social Determinants of  Health   Financial Resource Strain:   . Difficulty of Paying Living Expenses: Not on file  Food Insecurity:   . Worried About Charity fundraiser in the Last Year: Not on file  . Ran Out of Food in the Last Year: Not on file  Transportation Needs:   . Lack of Transportation (Medical): Not on file  . Lack of Transportation (Non-Medical): Not on file  Physical Activity:   . Days of Exercise per Week: Not on file  . Minutes of Exercise per Session: Not on file  Stress:   . Feeling of Stress : Not on file  Social Connections:   . Frequency of Communication with Friends and Family: Not on file  . Frequency of Social Gatherings with Friends and Family: Not on file  . Attends Religious Services: Not on file  . Active Member of Clubs or Organizations: Not on file  . Attends Archivist Meetings: Not on file  . Marital Status: Not on file     Family History: The patient's family history includes Breast cancer in her sister; Colon cancer in her maternal aunt; Heart disease in her brother; Irritable bowel syndrome in her mother.  ROS:   Review of Systems  Constitution: Negative for decreased appetite, fever and weight gain.  HENT: Negative for congestion, ear discharge, hoarse voice and sore throat.   Eyes: Negative for discharge, redness, vision loss in right eye and visual halos.  Cardiovascular: Palpitations.  Negative for chest pain, dyspnea on exertion, leg swelling, orthopnea. Respiratory: Negative for cough, hemoptysis, shortness of breath and snoring.   Endocrine: Negative for heat intolerance and polyphagia.  Hematologic/Lymphatic: Negative for bleeding problem. Does not bruise/bleed easily.  Skin:  Negative for flushing, nail changes, rash and suspicious lesions.  Musculoskeletal: Negative for arthritis, joint pain, muscle cramps, myalgias, neck pain and stiffness.  Gastrointestinal: Negative for abdominal pain, bowel incontinence, diarrhea and excessive appetite.  Genitourinary: Negative for decreased libido, genital sores and incomplete emptying.  Neurological: Negative for brief paralysis, focal weakness, headaches and loss of balance.  Psychiatric/Behavioral: Negative for altered mental status, depression and suicidal ideas.  Allergic/Immunologic: Negative for HIV exposure and persistent infections.    EKGs/Labs/Other Studies Reviewed:    The following studies were reviewed today:   EKG: None   Left heart catheter June 13, 2019  RPDA-1 lesion is 60% stenosed. RPDA-2 lesion is 80% stenosed -proximal to previous stent  A drug-eluting stent was successfully placed from near ostial PDA overlapping the previous stent, using a STENT SYNERGY DES 2.5X28. Postdilated 2.8 mm  Previously placed RPDA-3 stent (Xience DES) is widely patent.  Previously placed 1st RPL-1 stent (Xience DES) is widely patent.  1st RPL-2 lesion is 90% stenosed just distal to previous stent.  A drug-eluting stent was successfully placed overlapping the previous stent distally, using a STENT SYNERGY DES 2.5X28. Postdilated 2.8 mm  Post intervention, there is a 0% residual stenosis.  ------------------------------------------  Previously placed proximal-mid LAD overlapping stents (Xience DES) are widely patent.  Previously placed Dist LAD DES stent (Xience DES) is widely patent.  Ramus lesion is 90% stenosed. Vessels were too small for PCI. Recommend medical management.  Ost RCA to Prox RCA lesion is 55% stenosed. Mid Cx lesion is 55% stenosed.  ---------------------------------------------------  The left ventricular systolic function is normal. The left ventricular ejection fraction is 55-65%  by visual estimate. LV end diastolic pressure is mildly elevated.   SUMMARY  Severe 3-4 Vessel Disease:  ? Widely patent  proximal and mid LAD overlapping stents as well as distal LAD stent,  ? Widely patent RPDA stent with diffuse disease proximal most notable 60 to 80%,   Successful DES PCI of RPDA ostium to mid overlapping prior stent (Synergy DES 2.5 mm x 28 mm--2.8 mm).  ? Widely patent RPL 1 stent with focal 90% stenosis just distal to the stent edge.    Successful DES PCI of RPL 1 with Synergy DES 2.5 mm x 8 mm--2.8 mm overlapping distal to previous stent. ? 90% superior branch of small caliber ramus intermedius (medical management).   ? Otherwise moderate nonobstructive ostial RCA and mid LCx disease.  Normal LVEF and EDP.     11/16/2017:  Angiographic findings  Cardiac Arteries and Lesion Findings LMCA: 0%. LAD:  Lesion on Prox LAD: 10% stenosis. LCx:  Lesion on Mid CX: 40% stenosis. RCA:  Lesion on Prox RCA: 30% stenosis. Ramus:  Lesion on Ramus: 70% stenosis.  Comments:small  04/01/2017: Diagnostic Summary Pt with known severe LAD/RCA/Cir lesion. Pt is not a great candidate for CABG and refused CABG. Pt returned for multivessel stenting. Diagnostic Recommendations 1. PCI to prox, mid and distal LAD 2. PCI to R-PL and R-PDA lesion 3. Cir and Ramus do not appear to be severe, will leave these alone. Interventional Summary Successful PCI / Xience Drug Eluting Stent of the distal Left Anterior Descending Coronary Artery. Successful PCI / Xience Drug Eluting Stent of the mid Left Anterior Descending Coronary Artery. Successful PCI / Xience Drug Eluting Stent of the proximal Left Anterior Descending Coronary Artery. Successful PCI / Xience Drug Eluting Stent of the mid Posterolateral Coronary Artery. Successful PCI / Xience Drug Eluting Stent of the mid Posterior Descending Coronary Artery.  03/17/2017:  Angiographic findings  Cardiac Arteries and  Lesion Findings LMCA: 0%. LAD:  Lesion on Prox LAD: 90% stenosis.  Lesion on Mid LAD: 80% stenosis. LCx: 0%.  Lesion on Prox CX: 80% stenosis. RCA:  Lesion on Prox RCA: 40% stenosis.  Lesion on 1st RPL: 90% stenosis.  Lesion on R PDA: 90% stenosis. Ramus:  Lesion on Ramus: 80% stenosis. LV function assessed [id number]. Ejection Fraction  - Method: LV gram. EF%: 65  Recent Labs: 06/14/2019: BUN 8; Creatinine, Ser 0.68; Hemoglobin 11.5; Platelets 276; Potassium 3.6; Sodium 143  Recent Lipid Panel No results found for: CHOL, TRIG, HDL, CHOLHDL, VLDL, LDLCALC, LDLDIRECT  Physical Exam:    VS:  BP (!) 180/88   Pulse 74   Ht 5\' 3"  (1.6 m)   Wt 152 lb 3.2 oz (69 kg)   BMI 26.96 kg/m     Wt Readings from Last 3 Encounters:  06/26/19 152 lb 3.2 oz (69 kg)  06/14/19 151 lb (68.5 kg)  06/06/19 151 lb 12.8 oz (68.9 kg)     GEN: Well nourished, well developed in no acute distress HEENT: Normal NECK: No JVD; No carotid bruits LYMPHATICS: No lymphadenopathy CARDIAC: S1S2 noted,RRR, no murmurs, rubs, gallops RESPIRATORY:  Clear to auscultation without rales, wheezing or rhonchi  ABDOMEN: Soft, non-tender, non-distended, +bowel sounds, no guarding. EXTREMITIES: No edema, No cyanosis, no clubbing MUSCULOSKELETAL:  No edema; No deformity  SKIN: Warm and dry NEUROLOGIC:  Alert and oriented x 3, non-focal PSYCHIATRIC:  Normal affect, good insight  ASSESSMENT:    1. Coronary artery disease involving native coronary artery of native heart with unstable angina pectoris (HCC)   2. Palpitations   3. Mixed hyperlipidemia   4. Essential hypertension    PLAN:  1.  Her procedure on June 13, 2019 was successful with PCI of DES to the RPDA, RPLB, with previously patent stents.  At this time she will continue her dual antiplatelet therapy indefinitely.  She is currently on Crestor 5 mg daily, I would like to increase this to a higher dose per the patient tells me that he has  had more issues with her Crestor and had not been taking it daily therefore she prefers to start taking his medicine daily before any increases can be made.  She is also on metoprolol succinate 50 mg daily which she will continue for now.  The left ventriculogram performed to her catheterization showed EF of 55 to 65%.  2.  She is hypertensive today in the office but she had not taking her medications prior to her visit.  Was able to review her blood pressure trend at home she is averaging systolic between [id number] to Q000111Q millimeters mercury.  There are some pressure that are within the 100-120 20 mercury but the patient tells me all this time she has been lightheaded.  Says she did not take her medication today I am not going to change her antihypertensive regimen especially in the setting of her lightheadedness during the times of her systolic blood pressure being in the 100-120s.  I have asked her to continue to take her blood pressure at home and she will follow-up in 3 months  3.  In terms of her palpitations a 14-day ZIO will be placed on the patient to assess for any rhythm changes or any heart rates excursions  The patient is in agreement with the above plan. The patient left the office in stable condition.  The patient will follow up in 3 months with Dr. Bettina Gavia.   Medication Adjustments/Labs and Tests Ordered: Current medicines are reviewed at length with the patient today.  Concerns regarding medicines are outlined above.  Orders Placed This Encounter  Procedures  . Lipid Profile  . LONG TERM MONITOR (3-14 DAYS)   No orders of the defined types were placed in this encounter.   Patient Instructions  Medication Instructions:  Your physician recommends that you continue on your current medications as directed. Please refer to the Current Medication list given to you today.  *If you need a refill on your cardiac medications before your next appointment, please call your pharmacy*  Lab  Work: Your physician recommends that you return for lab work in:   BEFORE NEXT VISIT: Fasting Lipid  If you have labs (blood work) drawn today and your tests are completely normal, you will receive your results only by: Marland Kitchen MyChart Message (if you have MyChart) OR . A paper copy in the mail If you have any lab test that is abnormal or we need to change your treatment, we will call you to review the results.  Testing/Procedures: A zio monitor was ordered today. It will remain on for 14 days. You will then return monitor and event diary in provided box. It takes 1-2 weeks for report to be downloaded and returned to [place]. We will call you with the results. If monitor falls off or has orange flashing light, please call Zio for further instructions.     Follow-Up: At [facility], you and your health needs are our priority.  As part of our continuing mission to provide you with exceptional heart care, we have created designated Provider Care Teams.  These Care Teams include your primary Cardiologist (physician) and Advanced Practice Providers (APPs -  Physician Assistants and Nurse Practitioners) who all work together to provide you with the care you need, when you need it.  Your next appointment:   3 month(s)  The format for your next appointment:   In Person  Provider:   Shirlee More, MD  Other Instructions      Adopting a Healthy Lifestyle.  Know what a healthy weight is for you (roughly BMI <25) and aim to maintain this   Aim for 7+ servings of fruits and vegetables daily   65-80+ fluid ounces of water or unsweet tea for healthy kidneys   Limit to max 1 drink of alcohol per day; avoid smoking/tobacco   Limit animal fats in diet for cholesterol and heart health - choose grass fed whenever available   Avoid highly processed foods, and foods high in saturated/trans fats   Aim for low stress - take time to unwind and care for your mental health   Aim for 150 min of moderate  intensity exercise weekly for heart health, and weights twice weekly for bone health   Aim for 7-9 hours of sleep daily   When it comes to diets, agreement about the perfect plan isnt easy to find, even among the experts. Experts at the [employer] developed an idea known as the Healthy Eating Plate. Just imagine a plate divided into logical, healthy portions.   The emphasis is on diet quality:   Load up on vegetables and fruits - one-half of your plate: Aim for color and variety, and remember that potatoes dont count.   Go for whole grains - one-quarter of your plate: Whole wheat, barley, wheat berries, quinoa, oats, brown rice, and foods made with them. If you want pasta, go with whole wheat pasta.   Protein power - one-quarter of your plate: Fish, chicken, beans, and nuts are all healthy, versatile protein sources. Limit red meat.   The diet, however, does go beyond the plate, offering a few other suggestions.   Use healthy plant oils, such as olive, canola, soy, corn, sunflower and peanut. Check the labels, and avoid partially hydrogenated oil, which have unhealthy trans fats.   If youre thirsty, drink water. Coffee and tea are good in moderation, but skip sugary drinks and limit milk and dairy products to one or two daily servings.   The type of carbohydrate in the diet is more important than the amount. Some sources of carbohydrates, such as vegetables, fruits, whole grains, and beans-are healthier than others.   Finally, stay active  Signed, Berniece Salines, DO  06/26/2019 9:29 AM    [facility]

## 2019-07-13 ENCOUNTER — Other Ambulatory Visit: Payer: Self-pay | Admitting: *Deleted

## 2019-07-13 ENCOUNTER — Telehealth: Payer: Self-pay | Admitting: Cardiology

## 2019-07-13 MED ORDER — CLOPIDOGREL BISULFATE 75 MG PO TABS: 75.0000 mg | ORAL_TABLET | Freq: Every day | ORAL | 1 refills | Status: DC

## 2019-07-13 MED ORDER — METOPROLOL SUCCINATE ER 50 MG PO TB24: 50.0000 mg | ORAL_TABLET | Freq: Two times a day (BID) | ORAL | 1 refills | Status: AC

## 2019-07-13 NOTE — Telephone Encounter (Signed)
Call metoprolol and plavix to [facility]

## 2019-07-13 NOTE — Telephone Encounter (Signed)
Refills sent to pharmacy. 

## 2019-07-18 DIAGNOSIS — Z79899 Other long term (current) drug therapy: Secondary | ICD-10-CM | POA: Diagnosis not present

## 2019-07-18 DIAGNOSIS — Z7982 Long term (current) use of aspirin: Secondary | ICD-10-CM | POA: Diagnosis not present

## 2019-07-18 DIAGNOSIS — Z955 Presence of coronary angioplasty implant and graft: Secondary | ICD-10-CM | POA: Diagnosis not present

## 2019-07-18 DIAGNOSIS — K219 Gastro-esophageal reflux disease without esophagitis: Secondary | ICD-10-CM | POA: Diagnosis not present

## 2019-07-18 DIAGNOSIS — I10 Essential (primary) hypertension: Secondary | ICD-10-CM | POA: Diagnosis not present

## 2019-07-20 ENCOUNTER — Ambulatory Visit: Payer: PPO | Admitting: Cardiology

## 2019-07-21 DIAGNOSIS — R002 Palpitations: Secondary | ICD-10-CM | POA: Diagnosis not present

## 2019-07-31 DIAGNOSIS — M85852 Other specified disorders of bone density and structure, left thigh: Secondary | ICD-10-CM | POA: Diagnosis not present

## 2019-07-31 DIAGNOSIS — N959 Unspecified menopausal and perimenopausal disorder: Secondary | ICD-10-CM | POA: Diagnosis not present

## 2019-07-31 DIAGNOSIS — Z1231 Encounter for screening mammogram for malignant neoplasm of breast: Secondary | ICD-10-CM | POA: Diagnosis not present

## 2019-07-31 DIAGNOSIS — M47816 Spondylosis without myelopathy or radiculopathy, lumbar region: Secondary | ICD-10-CM | POA: Diagnosis not present

## 2019-07-31 DIAGNOSIS — M810 Age-related osteoporosis without current pathological fracture: Secondary | ICD-10-CM | POA: Diagnosis not present

## 2019-08-02 DIAGNOSIS — Z955 Presence of coronary angioplasty implant and graft: Secondary | ICD-10-CM | POA: Diagnosis not present

## 2019-08-14 ENCOUNTER — Other Ambulatory Visit: Payer: Self-pay | Admitting: Cardiology

## 2019-08-16 ENCOUNTER — Other Ambulatory Visit: Payer: Self-pay | Admitting: Cardiology

## 2019-08-16 DIAGNOSIS — N390 Urinary tract infection, site not specified: Secondary | ICD-10-CM | POA: Diagnosis not present

## 2019-08-17 ENCOUNTER — Telehealth: Payer: Self-pay | Admitting: Cardiology

## 2019-08-17 NOTE — Telephone Encounter (Signed)
Will forward to Dr Harriet Masson for review

## 2019-08-17 NOTE — Telephone Encounter (Signed)
Please let the patient know that her monitor showed  paroxysmal supraventricular tachycardia. Which means she had intermittent fast heart rate that came solely from the top of your heart. No atrial fibrillation noted during this time.   If you are still experiencing worsening palpitations we can increase your beta blocker.

## 2019-08-17 NOTE — Telephone Encounter (Signed)
New Message   Patient is calling to obtain results of monitor.

## 2019-08-17 NOTE — Telephone Encounter (Signed)
Advised patient. Per patient her palpitations may be a little better since wearing the monitor, but states no worse. She will continue current Toprol dose and discuss with Dr Bettina Gavia at follow up. Advised to call back if anything changed between now and then, verbalized understanding.

## 2019-08-28 DIAGNOSIS — F418 Other specified anxiety disorders: Secondary | ICD-10-CM | POA: Diagnosis not present

## 2019-08-28 DIAGNOSIS — Z955 Presence of coronary angioplasty implant and graft: Secondary | ICD-10-CM | POA: Diagnosis not present

## 2019-08-28 DIAGNOSIS — K2100 Gastro-esophageal reflux disease with esophagitis, without bleeding: Secondary | ICD-10-CM | POA: Diagnosis not present

## 2019-08-28 DIAGNOSIS — I10 Essential (primary) hypertension: Secondary | ICD-10-CM | POA: Diagnosis not present

## 2019-08-28 DIAGNOSIS — Z6827 Body mass index (BMI) 27.0-27.9, adult: Secondary | ICD-10-CM | POA: Diagnosis not present

## 2019-08-28 DIAGNOSIS — Z95820 Peripheral vascular angioplasty status with implants and grafts: Secondary | ICD-10-CM | POA: Diagnosis not present

## 2019-08-28 DIAGNOSIS — M799 Soft tissue disorder, unspecified: Secondary | ICD-10-CM | POA: Diagnosis not present

## 2019-08-28 DIAGNOSIS — E785 Hyperlipidemia, unspecified: Secondary | ICD-10-CM | POA: Diagnosis not present

## 2019-08-28 DIAGNOSIS — M159 Polyosteoarthritis, unspecified: Secondary | ICD-10-CM | POA: Diagnosis not present

## 2019-09-24 NOTE — Progress Notes (Signed)
Cardiology Office Note:    Date:  09/25/2019   ID:  Pamela Hogan, DOB [birth year], MRN [id number]  PCP:  Lowella Dandy, NP  Cardiologist:  Shirlee More, MD    Referring MD: Lowella Dandy, NP    ASSESSMENT:    1. Coronary artery disease involving native coronary artery of native heart with unstable angina pectoris ([place])   2. Mixed hyperlipidemia   3. Essential hypertension    PLAN:    In order of problems listed above:  1. Stable she is doing well since her last PCI no anginal discomfort continue medical treatment including long-term dual antiplatelet therapy along with lipid-lowering at high intensity statin. 2. Continue current treatment blood pressure usually at target prior to leaving the office we will recheck the blood pressure continue beta-blocker and clonidine 3. Lipids are at target continue current statin   Next appointment: 6 months   Medication Adjustments/Labs and Tests Ordered: Current medicines are reviewed at length with the patient today.  Concerns regarding medicines are outlined above.  No orders of the defined types were placed in this encounter.  No orders of the defined types were placed in this encounter.   Chief Complaint  Patient presents with  . Follow-up  . Coronary Artery Disease    History of Present Illness:    Pamela Hogan is a 85 y.o. female with a hx of multivessel CAD declined for bypass surgery due to comorbidities and had PCI in 2018.  Other problems include hypertension and dyslipidemia.  Left heart catheterization 11/16/2017 showed mild nonobstructive CAD with a 70% ramus stenosis.  Left heart catheterization performed 04/01/2017 resulted in PCI to the proximal mid and distal left anterior descending coronary artery and PCI to the right-sided PDA and posterior lateral branch with a total of 5  Xience drug-eluting stents.   She was last seen 06/06/2019 . Compliance with diet, lifestyle and medications: Yes  I reviewed the  results of the coronary angiogram as well as smirk event monitor with patient.  Recent labs from her primary care physician show a normal CMP cholesterol at target 156 HDL 53 LDL 72.  From a cardiology perspective she is doing well no angina dyspnea palpitation or syncope but complains bitterly of neuropathic pain in the lower extremities.  She does not have claudication.  Left heart catheterization 06/13/2019 independently reviewed: She had PCI with a drug-eluting stent vasodilators posterior lateral branch overlapping the previous distal stent as well as the ostial posterior descending artery..  Other stent sites are patent without restenosis.  Coronary Diagrams  Diagnostic Dominance: Right  Intervention     An event monitor was performed showing 6 brief runs of atrial tachycardia the longest 10 complexes and rare ventricular and supraventricular ectopy.  The diary and triggered events were unassociated with arrhythmia.  He was ordered by Dr. Harriet Masson and I personally reviewed this. Past Medical History:  Diagnosis Date  . Arthritis   . Barrett's esophagus   . Benign neoplasm of colon   . Chronic gastritis   . Colon polyp   . DDD (degenerative disc disease)   . Diverticulosis of colon (without mention of hemorrhage)   . Duodenal ulcer   . Gastritis   . GERD (gastroesophageal reflux disease)   . Hemorrhoids   . Hyperlipidemia   . Hypertension   . MVP (mitral valve prolapse)   . Pancreatitis   . Stricture and stenosis of esophagus   . TIA (transient ischemic attack)  Past Surgical History:  Procedure Laterality Date  . ABDOMINAL HYSTERECTOMY    . APPENDECTOMY    . BACK SURGERY     For DDD   . BREAST SURGERY    . CHOLECYSTECTOMY    . CORONARY STENT INTERVENTION  04/01/2017   [place]: 5 Xience DES stents placed - dLAD 2.5 x 8 (2.75 mm), mLAD 2.5 x 15, pLAD 3.0 x 8; rPL1 2.5 x 12, rPDA 2.5 x 8.  Stable RI ~65%. & mCx 50% - Med Rx.  EF 65%.   . CORONARY STENT INTERVENTION N/A  06/13/2019   Procedure: CORONARY STENT INTERVENTION;  Surgeon: Leonie Man, MD;  Location: [facility];  Service: Cardiovascular;  Laterality: N/A;  . FOOT SURGERY     Right   . KNEE ARTHROSCOPY     Bilateral   . LEFT HEART CATH AND CORONARY ANGIOGRAPHY  03/17/2017   [facility]: pLAD 90%, mLAD 80%, pCx 80. RI 80% (small); pRCA 40%, rPL1 90%, PDA 90% , EF 65%.  Referred for CABG -> then STAGED PCI  . LEFT HEART CATH AND CORONARY ANGIOGRAPHY  11/16/2017   [place] -patent LAD, PL and PDA stents. pLAD 10%, mCx 40%, RI ~70%. EF ~65%.  Med Rx.  Marland Kitchen LEFT HEART CATH AND CORONARY ANGIOGRAPHY N/A 06/13/2019   Procedure: LEFT HEART CATH AND CORONARY ANGIOGRAPHY;  Surgeon: Leonie Man, MD;  Location: [facility];  Service: Cardiovascular;  Laterality: N/A;  . NECK SURGERY    . TONSILLECTOMY     85 yrs old     Current Medications: Current Meds  Medication Sig  . acetaminophen (TYLENOL) 650 MG CR tablet Take 650 mg by mouth every 8 (eight) hours as needed for pain.  Marland Kitchen ALPRAZolam (XANAX) 0.5 MG tablet Take 0.5 mg by mouth See admin instructions. Take 0.5 mg at night, may take a second 0.5 mg dose during the day as needed for anxiety  . aluminum-magnesium hydroxide-simethicone (MAALOX) I037812 MG/5ML SUSP Take 30 mLs by mouth 3 (three) times daily as needed (indigestion).  . ARTIFICIAL TEAR SOLUTION OP Place 1 drop into both eyes daily as needed (dry eyes).  Marland Kitchen aspirin EC 81 MG tablet Take 1 tablet (81 mg total) by mouth daily. (Patient taking differently: Take 81 mg by mouth at bedtime. )  . calcium carbonate (TUMS - DOSED IN MG ELEMENTAL CALCIUM) 500 MG chewable tablet Chew 1 tablet by mouth 2 (two) times daily as needed for indigestion or heartburn.  . cetirizine (ZYRTEC) 10 MG tablet Take 10 mg by mouth as needed for allergies.  . cholecalciferol (VITAMIN D3) 25 MCG (1000 UT) tablet Take 1,000 Units by mouth 2 (two) times daily.  . cloNIDine (CATAPRES) 0.1 MG tablet Take 1 tablet  (0.1 mg total) by mouth at bedtime.  . clopidogrel (PLAVIX) 75 MG tablet Take 1 tablet (75 mg total) by mouth at bedtime.  . Coenzyme Q10 (COQ-10) 100 MG CAPS Take 100 mg by mouth at bedtime.  . diclofenac (FLECTOR) 1.3 % PTCH Place 1 patch onto the skin daily as needed (pain).  Marland Kitchen dicyclomine (BENTYL) 10 MG capsule Take 1 capsule (10 mg total) by mouth as needed. (Patient taking differently: Take 10 mg by mouth as needed for spasms. )  . esomeprazole (NEXIUM) 40 MG capsule Take 40 mg by mouth daily at 12 noon.  . fluticasone (FLONASE) 50 MCG/ACT nasal spray Place 2 sprays into the nose at bedtime.   Marland Kitchen levothyroxine (SYNTHROID) 50 MCG tablet Take  50 mcg by mouth daily before breakfast.  . metoprolol succinate (TOPROL-XL) 50 MG 24 hr tablet Take 1 tablet (50 mg total) by mouth 2 (two) times daily.  . Multiple Vitamins-Minerals (PRESERVISION AREDS 2) CAPS Take 1 capsule by mouth 2 (two) times daily.  . nitroGLYCERIN (NITROSTAT) 0.4 MG SL tablet Place 0.4 mg under the tongue every 5 (five) minutes as needed for chest pain.  Marland Kitchen olmesartan (BENICAR) 20 MG tablet Take 20 mg by mouth daily.  . Omega-3 Fatty Acids (FISH OIL) 1000 MG CAPS Take 2,000 mg by mouth.  . rosuvastatin (CRESTOR) 5 MG tablet Take 5 mg by mouth daily.     Allergies:   Iohexol and Ranexa [ranolazine]   Social History   Socioeconomic History  . Marital status: Widowed    Spouse name: Not on file  . Number of children: Not on file  . Years of education: Not on file  . Highest education level: Not on file  Occupational History  . Occupation: [employer]  Tobacco Use  . Smoking status: Never Smoker  . Smokeless tobacco: Never Used  Substance and Sexual Activity  . Alcohol use: No  . Drug use: No  . Sexual activity: Not on file  Other Topics Concern  . Not on file  Social History Narrative  . Not on file   Social Determinants of Health   Financial Resource Strain:   . Difficulty of Paying Living Expenses:   Food  Insecurity:   . Worried About Charity fundraiser in the Last Year:   . Arboriculturist in the Last Year:   Transportation Needs:   . Film/video editor (Medical):   Marland Kitchen Lack of Transportation (Non-Medical):   Physical Activity:   . Days of Exercise per Week:   . Minutes of Exercise per Session:   Stress:   . Feeling of Stress :   Social Connections:   . Frequency of Communication with Friends and Family:   . Frequency of Social Gatherings with Friends and Family:   . Attends Religious Services:   . Active Member of Clubs or Organizations:   . Attends Archivist Meetings:   Marland Kitchen Marital Status:      Family History: The patient's family history includes Breast cancer in her sister; Colon cancer in her maternal aunt; Heart disease in her brother; Irritable bowel syndrome in her mother. ROS:   Please see the history of present illness.    All other systems reviewed and are negative.  EKGs/Labs/Other Studies Reviewed:    The following studies were reviewed today:   Recent Labs: 06/14/2019: BUN 8; Creatinine, Ser 0.68; Hemoglobin 11.5; Platelets 276; Potassium 3.6; Sodium 143  Recent Lipid Panel No results found for: CHOL, TRIG, HDL, CHOLHDL, VLDL, LDLCALC, LDLDIRECT  Physical Exam:    VS:  BP (!) 162/78   Pulse 72   Temp 98.4 F (36.9 C)   Ht 5\' 3"  (1.6 m)   Wt 153 lb (69.4 kg)   SpO2 99%   BMI 27.10 kg/m     Wt Readings from Last 3 Encounters:  09/25/19 153 lb (69.4 kg)  06/26/19 152 lb 3.2 oz (69 kg)  06/14/19 151 lb (68.5 kg)     GEN:  Well nourished, well developed in no acute distress HEENT: Normal NECK: No JVD; No carotid bruits LYMPHATICS: No lymphadenopathy CARDIAC: RRR, no murmurs, rubs, gallops RESPIRATORY:  Clear to auscultation without rales, wheezing or rhonchi  ABDOMEN: Soft, non-tender, non-distended MUSCULOSKELETAL:  No edema; No deformity  SKIN: Warm and dry NEUROLOGIC:  Alert and oriented x 3 PSYCHIATRIC:  Normal affect     Signed, Shirlee More, MD  09/25/2019 8:35 AM    [facility]

## 2019-09-25 ENCOUNTER — Encounter: Payer: Self-pay | Admitting: Cardiology

## 2019-09-25 ENCOUNTER — Other Ambulatory Visit: Payer: Self-pay

## 2019-09-25 ENCOUNTER — Ambulatory Visit (INDEPENDENT_AMBULATORY_CARE_PROVIDER_SITE_OTHER): Payer: PPO | Admitting: Cardiology

## 2019-09-25 VITALS — BP 140/80 | HR 72 | Temp 98.4°F | Ht 63.0 in | Wt 153.0 lb

## 2019-09-25 DIAGNOSIS — I25110 Atherosclerotic heart disease of native coronary artery with unstable angina pectoris: Secondary | ICD-10-CM

## 2019-09-25 DIAGNOSIS — E782 Mixed hyperlipidemia: Secondary | ICD-10-CM | POA: Diagnosis not present

## 2019-09-25 DIAGNOSIS — I10 Essential (primary) hypertension: Secondary | ICD-10-CM

## 2019-09-25 NOTE — Patient Instructions (Signed)

## 2019-10-04 ENCOUNTER — Other Ambulatory Visit: Payer: Self-pay | Admitting: Cardiology

## 2019-10-05 DIAGNOSIS — R5383 Other fatigue: Secondary | ICD-10-CM | POA: Diagnosis not present

## 2019-10-05 DIAGNOSIS — Z6827 Body mass index (BMI) 27.0-27.9, adult: Secondary | ICD-10-CM | POA: Diagnosis not present

## 2019-10-05 DIAGNOSIS — E039 Hypothyroidism, unspecified: Secondary | ICD-10-CM | POA: Diagnosis not present

## 2019-10-05 DIAGNOSIS — R5381 Other malaise: Secondary | ICD-10-CM | POA: Diagnosis not present

## 2019-10-05 DIAGNOSIS — M159 Polyosteoarthritis, unspecified: Secondary | ICD-10-CM | POA: Diagnosis not present

## 2019-10-05 DIAGNOSIS — M797 Fibromyalgia: Secondary | ICD-10-CM | POA: Diagnosis not present

## 2019-10-26 DIAGNOSIS — H353211 Exudative age-related macular degeneration, right eye, with active choroidal neovascularization: Secondary | ICD-10-CM | POA: Diagnosis not present

## 2019-11-01 DIAGNOSIS — L821 Other seborrheic keratosis: Secondary | ICD-10-CM | POA: Diagnosis not present

## 2019-11-01 DIAGNOSIS — L578 Other skin changes due to chronic exposure to nonionizing radiation: Secondary | ICD-10-CM | POA: Diagnosis not present

## 2019-11-01 DIAGNOSIS — D225 Melanocytic nevi of trunk: Secondary | ICD-10-CM | POA: Diagnosis not present

## 2019-11-01 DIAGNOSIS — D1801 Hemangioma of skin and subcutaneous tissue: Secondary | ICD-10-CM | POA: Diagnosis not present

## 2019-11-28 DIAGNOSIS — M797 Fibromyalgia: Secondary | ICD-10-CM | POA: Diagnosis not present

## 2019-11-28 DIAGNOSIS — I10 Essential (primary) hypertension: Secondary | ICD-10-CM | POA: Diagnosis not present

## 2019-11-28 DIAGNOSIS — Z139 Encounter for screening, unspecified: Secondary | ICD-10-CM | POA: Diagnosis not present

## 2019-11-28 DIAGNOSIS — H3530 Unspecified macular degeneration: Secondary | ICD-10-CM | POA: Diagnosis not present

## 2019-11-28 DIAGNOSIS — I2510 Atherosclerotic heart disease of native coronary artery without angina pectoris: Secondary | ICD-10-CM | POA: Diagnosis not present

## 2019-11-28 DIAGNOSIS — E538 Deficiency of other specified B group vitamins: Secondary | ICD-10-CM | POA: Diagnosis not present

## 2019-11-28 DIAGNOSIS — Z20822 Contact with and (suspected) exposure to covid-19: Secondary | ICD-10-CM | POA: Diagnosis not present

## 2019-11-28 DIAGNOSIS — K2100 Gastro-esophageal reflux disease with esophagitis, without bleeding: Secondary | ICD-10-CM | POA: Diagnosis not present

## 2019-11-28 DIAGNOSIS — F418 Other specified anxiety disorders: Secondary | ICD-10-CM | POA: Diagnosis not present

## 2019-11-28 DIAGNOSIS — E785 Hyperlipidemia, unspecified: Secondary | ICD-10-CM | POA: Diagnosis not present

## 2019-11-28 DIAGNOSIS — M159 Polyosteoarthritis, unspecified: Secondary | ICD-10-CM | POA: Diagnosis not present

## 2019-11-28 DIAGNOSIS — Z6827 Body mass index (BMI) 27.0-27.9, adult: Secondary | ICD-10-CM | POA: Diagnosis not present

## 2019-11-29 ENCOUNTER — Other Ambulatory Visit: Payer: Self-pay | Admitting: Cardiology

## 2019-12-14 DIAGNOSIS — R1084 Generalized abdominal pain: Secondary | ICD-10-CM | POA: Diagnosis not present

## 2019-12-14 DIAGNOSIS — K1121 Acute sialoadenitis: Secondary | ICD-10-CM | POA: Diagnosis not present

## 2019-12-14 DIAGNOSIS — Z6827 Body mass index (BMI) 27.0-27.9, adult: Secondary | ICD-10-CM | POA: Diagnosis not present

## 2019-12-14 DIAGNOSIS — E785 Hyperlipidemia, unspecified: Secondary | ICD-10-CM | POA: Diagnosis not present

## 2019-12-19 DIAGNOSIS — G459 Transient cerebral ischemic attack, unspecified: Secondary | ICD-10-CM | POA: Diagnosis not present

## 2019-12-21 DIAGNOSIS — Z6827 Body mass index (BMI) 27.0-27.9, adult: Secondary | ICD-10-CM | POA: Diagnosis not present

## 2019-12-21 DIAGNOSIS — K1121 Acute sialoadenitis: Secondary | ICD-10-CM | POA: Diagnosis not present

## 2019-12-21 DIAGNOSIS — H9202 Otalgia, left ear: Secondary | ICD-10-CM | POA: Diagnosis not present

## 2019-12-21 DIAGNOSIS — S00412A Abrasion of left ear, initial encounter: Secondary | ICD-10-CM | POA: Diagnosis not present

## 2019-12-22 DIAGNOSIS — I6523 Occlusion and stenosis of bilateral carotid arteries: Secondary | ICD-10-CM | POA: Diagnosis not present

## 2019-12-22 DIAGNOSIS — G459 Transient cerebral ischemic attack, unspecified: Secondary | ICD-10-CM | POA: Diagnosis not present

## 2019-12-29 DIAGNOSIS — M2548 Effusion, other site: Secondary | ICD-10-CM | POA: Diagnosis not present

## 2019-12-29 DIAGNOSIS — G459 Transient cerebral ischemic attack, unspecified: Secondary | ICD-10-CM | POA: Diagnosis not present

## 2019-12-29 DIAGNOSIS — E236 Other disorders of pituitary gland: Secondary | ICD-10-CM | POA: Diagnosis not present

## 2019-12-29 DIAGNOSIS — I6782 Cerebral ischemia: Secondary | ICD-10-CM | POA: Diagnosis not present

## 2019-12-29 DIAGNOSIS — G319 Degenerative disease of nervous system, unspecified: Secondary | ICD-10-CM | POA: Diagnosis not present

## 2020-01-05 NOTE — Progress Notes (Signed)
[facility]  01/08/2020     CHIEF COMPLAINT Patient presents for Retina Evaluation   HISTORY OF PRESENT ILLNESS: Pamela Hogan is a 85 y.o. female who presents to the clinic today for:   HPI    Retina Evaluation    In both eyes.  This started 4 years ago.  Duration of 4 years.  Associated Symptoms Floaters.  Negative for Flashes, Pain, Trauma, Fever, Redness, Scalp Tenderness, Weight Loss, Distortion, Photophobia, Jaw Claudication, Fatigue, Blind Spot, Glare and Shoulder/Hip pain.  Context:  distance vision, mid-range vision and near vision.  Treatments tried include no treatments.  I, the attending physician,  performed the HPI with the patient and updated documentation appropriately.          Comments    Patient states wants 2nd opinion on ARMD. Was told dry ARMD was converting to wet ARMD in February or March 2021. Patient states no recent changes in vision. Diagnosed with pituitary tumor last week. Patient denies any changes in peripheral vision.        Last edited by Bernarda Caffey, MD on 01/08/2020  9:26 AM. (History)    pt was referred here by her PCP for second opinion for wet ARMD, pt saw Dr. Stacie Glaze on 04.29.21 [facility]) where she was told she has wet macular degeneration, but would not give her an injection bc her vision had not changed, she was told to come back in 3 months ready to get an injection, pt states she has been dx with a pituitary mass, her daughter states it is causing a lot of problems on the left side of her face, she states she is not undergoing treatment yet  Referring physician: Lowella Dandy, NP [place],  [place] [id number]  HISTORICAL INFORMATION:   Selected notes from the MEDICAL RECORD NUMBER Referred by Dr. Laverna Peace, NP LEE: 2019 Ocular Hx- MD OU PMH- HTN     CURRENT MEDICATIONS: Current Outpatient Medications (Ophthalmic Drugs)  Medication Sig   ARTIFICIAL TEAR SOLUTION OP Place 1  drop into both eyes daily as needed (dry eyes).   No current facility-administered medications for this visit. (Ophthalmic Drugs)   Current Outpatient Medications (Other)  Medication Sig   acetaminophen (TYLENOL) 650 MG CR tablet Take 650 mg by mouth every 8 (eight) hours as needed for pain.   ALPRAZolam (XANAX) 0.5 MG tablet Take 0.5 mg by mouth See admin instructions. Take 0.5 mg at night, may take a second 0.5 mg dose during the day as needed for anxiety   aluminum-magnesium hydroxide-simethicone (MAALOX) 778-242-35 MG/5ML SUSP Take 30 mLs by mouth 3 (three) times daily as needed (indigestion).   aspirin EC 81 MG tablet Take 1 tablet (81 mg total) by mouth daily. (Patient taking differently: Take 81 mg by mouth at bedtime. )   calcium carbonate (TUMS - DOSED IN MG ELEMENTAL CALCIUM) 500 MG chewable tablet Chew 1 tablet by mouth 2 (two) times daily as needed for indigestion or heartburn.   cetirizine (ZYRTEC) 10 MG tablet Take 10 mg by mouth as needed for allergies.   cholecalciferol (VITAMIN D3) 25 MCG (1000 UT) tablet Take 1,000 Units by mouth 2 (two) times daily.   cloNIDine (CATAPRES) 0.1 MG tablet TAKE 1 TABLET BY MOUTH AT BEDTIME   clopidogrel (PLAVIX) 75 MG tablet Take 1 tablet (75 mg total) by mouth at bedtime.   Coenzyme Q10 (COQ-10) 100 MG CAPS Take 100 mg by  mouth at bedtime.   diclofenac (FLECTOR) 1.3 % PTCH Place 1 patch onto the skin daily as needed (pain).   dicyclomine (BENTYL) 10 MG capsule Take 1 capsule (10 mg total) by mouth as needed. (Patient taking differently: Take 10 mg by mouth as needed for spasms. )   esomeprazole (NEXIUM) 40 MG capsule Take 40 mg by mouth daily at 12 noon.   fluticasone (FLONASE) 50 MCG/ACT nasal spray Place 2 sprays into the nose at bedtime.    levothyroxine (SYNTHROID) 50 MCG tablet Take 50 mcg by mouth daily before breakfast.   metoprolol succinate (TOPROL-XL) 50 MG 24 hr tablet Take 1 tablet (50 mg total) by mouth 2 (two) times  daily.   Multiple Vitamins-Minerals (PRESERVISION AREDS 2) CAPS Take 1 capsule by mouth 2 (two) times daily.   nitroGLYCERIN (NITROSTAT) 0.4 MG SL tablet Place 0.4 mg under the tongue every 5 (five) minutes as needed for chest pain.   olmesartan (BENICAR) 20 MG tablet Take 20 mg by mouth daily.   Omega-3 Fatty Acids (FISH OIL) 1000 MG CAPS Take 2,000 mg by mouth.   rosuvastatin (CRESTOR) 5 MG tablet Take 5 mg by mouth daily.   No current facility-administered medications for this visit. (Other)      REVIEW OF SYSTEMS: ROS    Positive for: Neurological, Musculoskeletal, Endocrine, Cardiovascular, Eyes   Negative for: Constitutional, Gastrointestinal, Skin, Genitourinary, HENT, Respiratory, Psychiatric, Allergic/Imm, Heme/Lymph   Last edited by Jobe Marker, COT on 01/08/2020  8:25 AM. (History)       ALLERGIES Allergies  Allergen Reactions   Iohexol     Some type of reaction to a dye 55 years ago caused bumps on the skin   Ranexa [Ranolazine]     Chest pain    PAST MEDICAL HISTORY Past Medical History:  Diagnosis Date   Arthritis    Barrett's esophagus    Benign neoplasm of colon    Chronic gastritis    Colon polyp    DDD (degenerative disc disease)    Diverticulosis of colon (without mention of hemorrhage)    Duodenal ulcer    Gastritis    GERD (gastroesophageal reflux disease)    Hemorrhoids    Hyperlipidemia    Hypertension    MVP (mitral valve prolapse)    Pancreatitis    Stricture and stenosis of esophagus    TIA (transient ischemic attack)    Past Surgical History:  Procedure Laterality Date   ABDOMINAL HYSTERECTOMY     APPENDECTOMY     BACK SURGERY     For DDD    BREAST SURGERY     CATARACT EXTRACTION Bilateral    CHOLECYSTECTOMY     CORONARY STENT INTERVENTION  04/01/2017   [place]: 5 Xience DES stents placed - dLAD 2.5 x 8 (2.75 mm), mLAD 2.5 x 15, pLAD 3.0 x 8; rPL1 2.5 x 12, rPDA 2.5 x 8.  Stable RI ~65%. & mCx 50%  - Med Rx.  EF 65%.    CORONARY STENT INTERVENTION N/A 06/13/2019   Procedure: CORONARY STENT INTERVENTION;  Surgeon: Leonie Man, MD;  Location: [facility];  Service: Cardiovascular;  Laterality: N/A;   EYE SURGERY     eyelids lifted   FOOT SURGERY     Right    KNEE ARTHROSCOPY     Bilateral    LEFT HEART CATH AND CORONARY ANGIOGRAPHY  03/17/2017   [facility]: pLAD 90%, mLAD 80%, pCx 80. RI 80% (small); pRCA 40%, rPL1 90%,  PDA 90% , EF 65%.  Referred for CABG -> then STAGED PCI   LEFT HEART CATH AND CORONARY ANGIOGRAPHY  11/16/2017   [place] -patent LAD, PL and PDA stents. pLAD 10%, mCx 40%, RI ~70%. EF ~65%.  Med Rx.   LEFT HEART CATH AND CORONARY ANGIOGRAPHY N/A 06/13/2019   Procedure: LEFT HEART CATH AND CORONARY ANGIOGRAPHY;  Surgeon: Leonie Man, MD;  Location: [facility];  Service: Cardiovascular;  Laterality: N/A;   NECK SURGERY     TONSILLECTOMY     85 yrs old     FAMILY HISTORY Family History  Problem Relation Age of Onset   Irritable bowel syndrome Mother    Glaucoma Mother    Heart disease Brother    Glaucoma Brother    Colon cancer Maternal Aunt    Breast cancer Sister        x 2   Macular degeneration Paternal Uncle     SOCIAL HISTORY Social History   Tobacco Use   Smoking status: Never Smoker   Smokeless tobacco: Never Used  Scientific laboratory technician Use: Never used  Substance Use Topics   Alcohol use: No   Drug use: No         OPHTHALMIC EXAM:  Base Eye Exam    Visual Acuity (Snellen - Linear)      Right Left   Dist cc 20/25 -2 20/25 +2   Dist ph cc 20/20 -2 20/20 -1   Correction: Glasses       Tonometry (Tonopen, 8:39 AM)      Right Left   Pressure 15 18       Pupils      Dark Light Shape React APD   Right 3.5 3.5 Round Minimal None   Left 4 4 Round Minimal None       Visual Fields (Counting fingers)      Left Right    Full Full       Extraocular Movement      Right Left    Full, Ortho Full,  Ortho  OS protrudes, no movement on cover testing       Neuro/Psych    Oriented x3: Yes   Mood/Affect: Normal       Dilation    Both eyes: 1.0% Mydriacyl, 2.5% Phenylephrine @ 8:39 AM        Slit Lamp and Fundus Exam    Slit Lamp Exam      Right Left   Lids/Lashes Dermatochalasis - upper lid Dermatochalasis - upper lid   Conjunctiva/Sclera White and quiet White and quiet   Cornea 1+ Punctate epithelial erosions, Debris in tear film 1+ Punctate epithelial erosions, Debris in tear film   Anterior Chamber Deep and quiet Deep and quiet   Iris Round and dilated Round and dilated   Lens PC IOL in good position PC IOL in good position   Vitreous Vitreous syneresis, Posterior vitreous detachment Vitreous syneresis       Fundus Exam      Right Left   Disc Mild Pallor, Sharp rim, peripapillary CNV with pigment clumping temporally Pink and Sharp, temporal PPA   C/D Ratio 0.2 0.2   Macula Flat, Blunted foveal reflex, Soft drusen, RPE mottling and clumping, peripapillary CNV with +cystic changes Flat, Blunted foveal reflex, RPE mottling and clumping, No heme or edema   Vessels Vascular attenuation, mild tortuousity, AV crossing changes Vascular attenuation, mild tortuousity, AV crossing changes   Periphery Attached, reticular degeneration, No  heme  Attached, reticular degeneration, No heme         Refraction    Wearing Rx      Sphere Cylinder Axis Add   Right -0.75 +0.50 036 +3.00   Left [place] Sphere  +3.00       Manifest Refraction      Sphere Cylinder Axis Dist VA   Right -0.75 +0.50 036 20/25+2   Left [place]   20/20-1          IMAGING AND PROCEDURES  Imaging and Procedures for 01/08/2020  OCT, Retina - OU - Both Eyes       Right Eye Quality was good. Central Foveal Thickness: 308. Progression has no prior data. Findings include normal foveal contour, no IRF, retinal drusen , subretinal fluid, pigment epithelial detachment, intraretinal hyper-reflective material (Small  peripapillary CNV with SRF).   Left Eye Quality was good. Central Foveal Thickness: 302. Progression has no prior data. Findings include normal foveal contour, no IRF, no SRF, retinal drusen , pigment epithelial detachment.   Notes *Images captured and stored on drive  Diagnosis / Impression:  OD: exu ARMD with small peripapillary CNV OS: non-exu ARMD  Clinical management:  See below  Abbreviations: NFP - Normal foveal profile. CME - cystoid macular edema. PED - pigment epithelial detachment. IRF - intraretinal fluid. SRF - subretinal fluid. EZ - ellipsoid zone. ERM - epiretinal membrane. ORA - outer retinal atrophy. ORT - outer retinal tubulation. SRHM - subretinal hyper-reflective material. IRHM - intraretinal hyper-reflective material                 ASSESSMENT/PLAN:    ICD-10-CM   1. Exudative age-related macular degeneration of right eye with active choroidal neovascularization ([place])  H35.3211   2. Retinal edema  H35.81 OCT, Retina - OU - Both Eyes  3. Intermediate stage nonexudative age-related macular degeneration of left eye  H35.3122   4. Essential hypertension  I10   5. Hypertensive retinopathy of both eyes  H35.033   6. Pseudophakia of both eyes  Z96.1     1,2. Exudative age related macular degeneration, OD  - small peripapillary CNV with +SRF --temporal disc/nasal macula  - pt here for second opinion following visit with Dr. Stacie Glaze on 4.29.21  - The incidence pathology and anatomy of wet AMD discussed   - The ANCHOR, MARINA, CATT and VIEW trials discussed with patient.    - discussed treatment options including observation vs intravitreal anti-VEGF agents such as Avastin, Lucentis, Eylea.    - Risks of endophthalmitis and vascular occlusive events and atrophic changes discussed with patient  - recommend IVA OD #1  - pt scheduled to return to Dr. Stacie Glaze later this month for further management in [place]  - f/u here PRN  3. Age related macular degeneration,  non-exudative, OS  - The incidence, anatomy, and pathology of dry AMD, risk of progression, and the AREDS and AREDS 2 study including smoking risks discussed with patient.  - Recommend amsler grid monitoring  - f/u PRN  4,5. Hypertensive retinopathy OU - discussed importance of tight BP control - monitor  6. Pseudophakia OU  - s/p CE/IOL  - IOL in good position, doing well  - monitor      Ophthalmic Meds Ordered this visit:  No orders of the defined types were placed in this encounter.      Return if symptoms worsen or fail to improve.  There are no Patient Instructions on file for this visit.   Explained  the diagnoses, plan, and follow up with the patient and they expressed understanding.  Patient expressed understanding of the importance of proper follow up care.   This document serves as a record of services personally performed by Gardiner Sleeper, MD, PhD. It was created on their behalf by Leonie Douglas, an ophthalmic technician. The creation of this record is the provider's dictation and/or activities during the visit.    Electronically signed by: Leonie Douglas COA, 01/08/20  12:09 PM  This document serves as a record of services personally performed by Gardiner Sleeper, MD, PhD. It was created on their behalf by San Jetty. Owens Shark, OA an ophthalmic technician. The creation of this record is the provider's dictation and/or activities during the visit.    Electronically signed by: San Jetty. Marguerita Merles 07.12.2021 12:09 PM   Gardiner Sleeper, M.D., Ph.D. Diseases & Surgery of the Retina and Vitreous [facility]  I have reviewed the above documentation for accuracy and completeness, and I agree with the above. Gardiner Sleeper, M.D., Ph.D. 01/08/20 12:09 PM   Abbreviations: M myopia (nearsighted); A astigmatism; H hyperopia (farsighted); P presbyopia; Mrx spectacle prescription;  CTL contact lenses; OD right eye; OS left eye; OU both eyes  XT exotropia;  ET esotropia; PEK punctate epithelial keratitis; PEE punctate epithelial erosions; DES dry eye syndrome; MGD meibomian gland dysfunction; ATs artificial tears; PFAT's preservative free artificial tears; [place] nuclear sclerotic cataract; PSC posterior subcapsular cataract; ERM epi-retinal membrane; PVD posterior vitreous detachment; RD retinal detachment; DM diabetes mellitus; DR diabetic retinopathy; NPDR non-proliferative diabetic retinopathy; PDR proliferative diabetic retinopathy; CSME clinically significant macular edema; DME diabetic macular edema; dbh dot blot hemorrhages; CWS cotton wool spot; POAG primary open angle glaucoma; C/D cup-to-disc ratio; HVF humphrey visual field; GVF goldmann visual field; OCT optical coherence tomography; IOP intraocular pressure; BRVO Branch retinal vein occlusion; CRVO central retinal vein occlusion; CRAO central retinal artery occlusion; BRAO branch retinal artery occlusion; RT retinal tear; SB scleral buckle; PPV pars plana vitrectomy; VH Vitreous hemorrhage; PRP panretinal laser photocoagulation; IVK intravitreal kenalog; VMT vitreomacular traction; MH Macular hole;  NVD neovascularization of the disc; NVE neovascularization elsewhere; AREDS age related eye disease study; ARMD age related macular degeneration; POAG primary open angle glaucoma; EBMD epithelial/anterior basement membrane dystrophy; ACIOL anterior chamber intraocular lens; IOL intraocular lens; PCIOL posterior chamber intraocular lens; Phaco/IOL phacoemulsification with intraocular lens placement; [place] photorefractive keratectomy; LASIK laser assisted in situ keratomileusis; HTN hypertension; DM diabetes mellitus; COPD chronic obstructive pulmonary disease

## 2020-01-08 ENCOUNTER — Encounter (INDEPENDENT_AMBULATORY_CARE_PROVIDER_SITE_OTHER): Payer: Self-pay | Admitting: Ophthalmology

## 2020-01-08 ENCOUNTER — Other Ambulatory Visit: Payer: Self-pay

## 2020-01-08 ENCOUNTER — Ambulatory Visit (INDEPENDENT_AMBULATORY_CARE_PROVIDER_SITE_OTHER): Payer: PPO | Admitting: Ophthalmology

## 2020-01-08 DIAGNOSIS — H35033 Hypertensive retinopathy, bilateral: Secondary | ICD-10-CM | POA: Diagnosis not present

## 2020-01-08 DIAGNOSIS — Z961 Presence of intraocular lens: Secondary | ICD-10-CM | POA: Diagnosis not present

## 2020-01-08 DIAGNOSIS — I10 Essential (primary) hypertension: Secondary | ICD-10-CM

## 2020-01-08 DIAGNOSIS — H353211 Exudative age-related macular degeneration, right eye, with active choroidal neovascularization: Secondary | ICD-10-CM

## 2020-01-08 DIAGNOSIS — H353122 Nonexudative age-related macular degeneration, left eye, intermediate dry stage: Secondary | ICD-10-CM

## 2020-01-08 DIAGNOSIS — H3581 Retinal edema: Secondary | ICD-10-CM

## 2020-01-11 DIAGNOSIS — E237 Disorder of pituitary gland, unspecified: Secondary | ICD-10-CM | POA: Diagnosis not present

## 2020-01-11 DIAGNOSIS — Z6827 Body mass index (BMI) 27.0-27.9, adult: Secondary | ICD-10-CM | POA: Diagnosis not present

## 2020-01-11 DIAGNOSIS — Z8673 Personal history of transient ischemic attack (TIA), and cerebral infarction without residual deficits: Secondary | ICD-10-CM | POA: Diagnosis not present

## 2020-01-11 DIAGNOSIS — K1121 Acute sialoadenitis: Secondary | ICD-10-CM | POA: Diagnosis not present

## 2020-01-11 DIAGNOSIS — I10 Essential (primary) hypertension: Secondary | ICD-10-CM | POA: Diagnosis not present

## 2020-01-18 DIAGNOSIS — H353211 Exudative age-related macular degeneration, right eye, with active choroidal neovascularization: Secondary | ICD-10-CM | POA: Diagnosis not present

## 2020-01-19 DIAGNOSIS — G459 Transient cerebral ischemic attack, unspecified: Secondary | ICD-10-CM | POA: Diagnosis not present

## 2020-01-19 DIAGNOSIS — G609 Hereditary and idiopathic neuropathy, unspecified: Secondary | ICD-10-CM | POA: Diagnosis not present

## 2020-01-19 DIAGNOSIS — I679 Cerebrovascular disease, unspecified: Secondary | ICD-10-CM | POA: Diagnosis not present

## 2020-01-19 DIAGNOSIS — E237 Disorder of pituitary gland, unspecified: Secondary | ICD-10-CM | POA: Diagnosis not present

## 2020-01-31 ENCOUNTER — Other Ambulatory Visit: Payer: Self-pay | Admitting: Cardiology

## 2020-01-31 DIAGNOSIS — E237 Disorder of pituitary gland, unspecified: Secondary | ICD-10-CM | POA: Diagnosis not present

## 2020-01-31 DIAGNOSIS — M2548 Effusion, other site: Secondary | ICD-10-CM | POA: Diagnosis not present

## 2020-01-31 DIAGNOSIS — I6782 Cerebral ischemia: Secondary | ICD-10-CM | POA: Diagnosis not present

## 2020-01-31 DIAGNOSIS — G9389 Other specified disorders of brain: Secondary | ICD-10-CM | POA: Diagnosis not present

## 2020-01-31 DIAGNOSIS — J3489 Other specified disorders of nose and nasal sinuses: Secondary | ICD-10-CM | POA: Diagnosis not present

## 2020-02-23 DIAGNOSIS — I679 Cerebrovascular disease, unspecified: Secondary | ICD-10-CM | POA: Diagnosis not present

## 2020-02-23 DIAGNOSIS — G459 Transient cerebral ischemic attack, unspecified: Secondary | ICD-10-CM | POA: Diagnosis not present

## 2020-02-23 DIAGNOSIS — D352 Benign neoplasm of pituitary gland: Secondary | ICD-10-CM | POA: Diagnosis not present

## 2020-02-23 DIAGNOSIS — G609 Hereditary and idiopathic neuropathy, unspecified: Secondary | ICD-10-CM | POA: Diagnosis not present

## 2020-03-05 DIAGNOSIS — M159 Polyosteoarthritis, unspecified: Secondary | ICD-10-CM | POA: Diagnosis not present

## 2020-03-05 DIAGNOSIS — E785 Hyperlipidemia, unspecified: Secondary | ICD-10-CM | POA: Diagnosis not present

## 2020-03-05 DIAGNOSIS — K2100 Gastro-esophageal reflux disease with esophagitis, without bleeding: Secondary | ICD-10-CM | POA: Diagnosis not present

## 2020-03-05 DIAGNOSIS — I10 Essential (primary) hypertension: Secondary | ICD-10-CM | POA: Diagnosis not present

## 2020-03-05 DIAGNOSIS — R1084 Generalized abdominal pain: Secondary | ICD-10-CM | POA: Diagnosis not present

## 2020-03-05 DIAGNOSIS — Z6827 Body mass index (BMI) 27.0-27.9, adult: Secondary | ICD-10-CM | POA: Diagnosis not present

## 2020-03-05 DIAGNOSIS — I2510 Atherosclerotic heart disease of native coronary artery without angina pectoris: Secondary | ICD-10-CM | POA: Diagnosis not present

## 2020-03-05 DIAGNOSIS — E237 Disorder of pituitary gland, unspecified: Secondary | ICD-10-CM | POA: Diagnosis not present

## 2020-03-05 DIAGNOSIS — M797 Fibromyalgia: Secondary | ICD-10-CM | POA: Diagnosis not present

## 2020-03-05 DIAGNOSIS — K861 Other chronic pancreatitis: Secondary | ICD-10-CM | POA: Diagnosis not present

## 2020-03-05 DIAGNOSIS — F418 Other specified anxiety disorders: Secondary | ICD-10-CM | POA: Diagnosis not present

## 2020-04-01 DIAGNOSIS — D352 Benign neoplasm of pituitary gland: Secondary | ICD-10-CM | POA: Diagnosis not present

## 2020-04-30 DIAGNOSIS — D352 Benign neoplasm of pituitary gland: Secondary | ICD-10-CM | POA: Diagnosis not present

## 2020-04-30 DIAGNOSIS — K861 Other chronic pancreatitis: Secondary | ICD-10-CM | POA: Diagnosis not present

## 2020-04-30 DIAGNOSIS — I10 Essential (primary) hypertension: Secondary | ICD-10-CM | POA: Diagnosis not present

## 2020-04-30 DIAGNOSIS — E785 Hyperlipidemia, unspecified: Secondary | ICD-10-CM | POA: Diagnosis not present

## 2020-05-02 DIAGNOSIS — E559 Vitamin D deficiency, unspecified: Secondary | ICD-10-CM | POA: Diagnosis not present

## 2020-05-02 DIAGNOSIS — D352 Benign neoplasm of pituitary gland: Secondary | ICD-10-CM | POA: Diagnosis not present

## 2020-05-06 ENCOUNTER — Other Ambulatory Visit: Payer: Self-pay

## 2020-05-06 DIAGNOSIS — D126 Benign neoplasm of colon, unspecified: Secondary | ICD-10-CM | POA: Insufficient documentation

## 2020-05-06 DIAGNOSIS — K635 Polyp of colon: Secondary | ICD-10-CM | POA: Insufficient documentation

## 2020-05-06 DIAGNOSIS — K269 Duodenal ulcer, unspecified as acute or chronic, without hemorrhage or perforation: Secondary | ICD-10-CM | POA: Insufficient documentation

## 2020-05-06 DIAGNOSIS — E785 Hyperlipidemia, unspecified: Secondary | ICD-10-CM | POA: Insufficient documentation

## 2020-05-06 DIAGNOSIS — K859 Acute pancreatitis without necrosis or infection, unspecified: Secondary | ICD-10-CM | POA: Insufficient documentation

## 2020-05-06 DIAGNOSIS — K2970 Gastritis, unspecified, without bleeding: Secondary | ICD-10-CM | POA: Insufficient documentation

## 2020-05-06 DIAGNOSIS — Z86010 Personal history of colonic polyps: Secondary | ICD-10-CM

## 2020-05-06 DIAGNOSIS — K2950 Unspecified chronic gastritis without bleeding: Secondary | ICD-10-CM | POA: Insufficient documentation

## 2020-05-06 DIAGNOSIS — M1990 Unspecified osteoarthritis, unspecified site: Secondary | ICD-10-CM | POA: Insufficient documentation

## 2020-05-06 DIAGNOSIS — H3530 Unspecified macular degeneration: Secondary | ICD-10-CM

## 2020-05-06 DIAGNOSIS — K5790 Diverticulosis of intestine, part unspecified, without perforation or abscess without bleeding: Secondary | ICD-10-CM

## 2020-05-06 DIAGNOSIS — I10 Essential (primary) hypertension: Secondary | ICD-10-CM | POA: Insufficient documentation

## 2020-05-06 DIAGNOSIS — Z95820 Peripheral vascular angioplasty status with implants and grafts: Secondary | ICD-10-CM

## 2020-05-06 DIAGNOSIS — K2270 Barrett's esophagus without dysplasia: Secondary | ICD-10-CM | POA: Insufficient documentation

## 2020-05-06 DIAGNOSIS — K219 Gastro-esophageal reflux disease without esophagitis: Secondary | ICD-10-CM | POA: Insufficient documentation

## 2020-05-06 DIAGNOSIS — G459 Transient cerebral ischemic attack, unspecified: Secondary | ICD-10-CM | POA: Insufficient documentation

## 2020-05-06 DIAGNOSIS — M7741 Metatarsalgia, right foot: Secondary | ICD-10-CM

## 2020-05-06 DIAGNOSIS — K222 Esophageal obstruction: Secondary | ICD-10-CM | POA: Insufficient documentation

## 2020-05-06 HISTORY — DX: Unspecified macular degeneration: H35.30

## 2020-05-07 ENCOUNTER — Ambulatory Visit: Payer: PPO | Admitting: Cardiology

## 2020-05-07 ENCOUNTER — Encounter: Payer: Self-pay | Admitting: Cardiology

## 2020-05-07 ENCOUNTER — Other Ambulatory Visit: Payer: Self-pay

## 2020-05-07 VITALS — BP 178/83 | HR 76 | Ht 63.0 in | Wt 156.8 lb

## 2020-05-07 DIAGNOSIS — I10 Essential (primary) hypertension: Secondary | ICD-10-CM | POA: Diagnosis not present

## 2020-05-07 DIAGNOSIS — I2510 Atherosclerotic heart disease of native coronary artery without angina pectoris: Secondary | ICD-10-CM

## 2020-05-07 DIAGNOSIS — E782 Mixed hyperlipidemia: Secondary | ICD-10-CM

## 2020-05-07 NOTE — Patient Instructions (Addendum)
Medication Instructions:  Your physician has recommended you make the following change in your medication:  STOP: Aspirin 325 mg START: Aspirin 81 mg take one tablet by mouth daily.   *If you need a refill on your cardiac medications before your next appointment, please call your pharmacy*   Lab Work: None If you have labs (blood work) drawn today and your tests are completely normal, you will receive your results only by: Marland Kitchen MyChart Message (if you have MyChart) OR . A paper copy in the mail If you have any lab test that is abnormal or we need to change your treatment, we will call you to review the results.   Testing/Procedures: None   Follow-Up: At [facility], you and your health needs are our priority.  As part of our continuing mission to provide you with exceptional heart care, we have created designated Provider Care Teams.  These Care Teams include your primary Cardiologist (physician) and Advanced Practice Providers (APPs -  Physician Assistants and Nurse Practitioners) who all work together to provide you with the care you need, when you need it.  We recommend signing up for the patient portal called "MyChart".  Sign up information is provided on this After Visit Summary.  MyChart is used to connect with patients for Virtual Visits (Telemedicine).  Patients are able to view lab/test results, encounter notes, upcoming appointments, etc.  Non-urgent messages can be sent to your provider as well.   To learn more about what you can do with MyChart, go to [email or web address].    Your next appointment:   6 month(s)  The format for your next appointment:   In Person  Provider:   Shirlee More, MD   Other Instructions

## 2020-05-07 NOTE — Progress Notes (Signed)
Cardiology Office Note:    Date:  05/07/2020   ID:  Pamela Hogan, DOB 03-15-1935, MRN [id number]  PCP:  Lowella Dandy, NP  Cardiologist:  Shirlee More, MD    Referring MD: Lowella Dandy, NP    ASSESSMENT:    1. Coronary artery disease involving native coronary artery of native heart without angina pectoris   2. Essential hypertension   3. Mixed hyperlipidemia    PLAN:    In order of problems listed above:  1. Stable CAD New [place] Heart Association class I having no angina continue her dual antiplatelet therapy along with lipid-lowering at this time I would not pursue an ischemia evaluation she is on a beta-blocker. 2. Poorly controlled asked her to check her blood pressure at a set time each day not sporadically and I think she needs additional therapy but she does not want me changing her medications pending endocrinology follow-up 3. Stable continue her statin   Next appointment: 6 months   Medication Adjustments/Labs and Tests Ordered: Current medicines are reviewed at length with the patient today.  Concerns regarding medicines are outlined above.  Orders Placed This Encounter  Procedures   EKG 12-Lead   No orders of the defined types were placed in this encounter.   No chief complaint on file.   History of Present Illness:    Pamela Hogan is a 85 y.o. female with a hx of CAD PCI in 2018 with stent to the proximal mid and distal left anterior descending coronary artery right sided PDA and posterior lateral branch a total of 5 Xience drug-eluting stents.  Subsequent left heart catheterization 06/13/2019 with PCI of posterior lateral branch overlapping the previous drug-eluting stent as well as the ostial PDA.  Other problems include hypertension and hyperlipidemia.  She was last seen 09/25/2019. Compliance with diet, lifestyle and medications: Yes  Her concern today is her blood pressure spikes in the context of pituitary adenoma.  She is sporadically checks  blood pressure at home that typically runs 140-150/70 and then if she does not feel well she checks and gets numbers up to 190.  She no longer is taking Catapres at bedtime.  I offered to alter her antihypertensive medicines she declined.  She wants to follow-up with Dr. Jens Som endocrinology before any changes.  She says she is feels anxious and at times quivering throughout her body but no palpitation no chest pain shortness of breath or syncope.  Recent MRI [facility] 01/31/2020 showed a 5 mm hypoenhancing lesion in the pituitary gland laboratory test 03/05/2020 creatinine 0.67 GFR 82 cc potassium 4.4 cholesterol 190 LDL 101 triglycerides 171 HDL 60 CBC normal hemoglobin 13.4. Past Medical History:  Diagnosis Date   ABDOMINAL PAIN RIGHT UPPER QUADRANT 07/15/2009   Qualifier: Diagnosis of  By: Henrene Pastor MD, Docia Chuck    Abnormal flushing and sweating 06/16/2016   Accelerating angina ([place]) 06/13/2019   Anemia 10/20/2017   Arthritis    Barrett's esophagus    Benign neoplasm of colon    Chronic gastritis    Chronic pain syndrome 04/08/2018   Colon polyp    CONSTIPATION 07/15/2009   Qualifier: Diagnosis of  By: Henrene Pastor MD, Docia Chuck    DDD (degenerative disc disease)    Diverticulosis of colon (without mention of hemorrhage)    Duodenal ulcer    Dyslipidemia 04/22/2017   Essential hypertension 06/05/2015   Exertional chest pain 03/11/2017   Formatting of this note might be different from the original. Added  automatically from request for surgery [phone]   Gastritis    GERD (gastroesophageal reflux disease)    Hematoma of arm 06/13/2019   Hemorrhoids    History of syncope 06/16/2016   Hx-TIA (transient ischemic attack) 02/13/2011   Hyperlipidemia    Hypertension    Long term (current) use of aspirin 04/22/2017   Macular degeneration 05/06/2020   Metatarsalgia of both feet 06/02/2018   MVP (mitral valve prolapse)    Pancreatitis    S/P right unicompartmental knee  replacement 02/21/2014   02/2013   Shortness of breath 06/05/2015   Stricture and stenosis of esophagus    TIA (transient ischemic attack)     Past Surgical History:  Procedure Laterality Date   ABDOMINAL HYSTERECTOMY     APPENDECTOMY     BACK SURGERY     For DDD    BREAST SURGERY     CATARACT EXTRACTION Bilateral    CHOLECYSTECTOMY     CORONARY STENT INTERVENTION  04/01/2017   [place]: 5 Xience DES stents placed - dLAD 2.5 x 8 (2.75 mm), mLAD 2.5 x 15, pLAD 3.0 x 8; rPL1 2.5 x 12, rPDA 2.5 x 8.  Stable RI ~65%. & mCx 50% - Med Rx.  EF 65%.    CORONARY STENT INTERVENTION N/A 06/13/2019   Procedure: CORONARY STENT INTERVENTION;  Surgeon: Leonie Man, MD;  Location: [place];  Service: Cardiovascular;  Laterality: N/A;   EYE SURGERY     eyelids lifted   FOOT SURGERY     Right    KNEE ARTHROSCOPY     Bilateral    LEFT HEART CATH AND CORONARY ANGIOGRAPHY  03/17/2017   [facility]: pLAD 90%, mLAD 80%, pCx 80. RI 80% (small); pRCA 40%, rPL1 90%, PDA 90% , EF 65%.  Referred for CABG -> then STAGED PCI   LEFT HEART CATH AND CORONARY ANGIOGRAPHY  11/16/2017   [place] -patent LAD, PL and PDA stents. pLAD 10%, mCx 40%, RI ~70%. EF ~65%.  Med Rx.   LEFT HEART CATH AND CORONARY ANGIOGRAPHY N/A 06/13/2019   Procedure: LEFT HEART CATH AND CORONARY ANGIOGRAPHY;  Surgeon: Leonie Man, MD;  Location: [place];  Service: Cardiovascular;  Laterality: N/A;   NECK SURGERY     TONSILLECTOMY     85 yrs old     Current Medications: Current Meds  Medication Sig   acetaminophen (TYLENOL) 650 MG CR tablet Take 650 mg by mouth every 8 (eight) hours as needed for pain.   ALPRAZolam (XANAX) 0.5 MG tablet Take 0.5 mg by mouth See admin instructions. Take 0.5 mg at night, may take a second 0.5 mg dose during the day as needed for anxiety   aluminum-magnesium hydroxide-simethicone (MAALOX) 211-941-74 MG/5ML SUSP Take 30 mLs by mouth 3 (three) times daily as needed  (indigestion).   ARTIFICIAL TEAR SOLUTION OP Place 1 drop into both eyes daily as needed (dry eyes).   aspirin 325 MG tablet Take 325 mg by mouth daily.   B Complex-C (B-COMPLEX WITH VITAMIN C) tablet Take 1 tablet by mouth daily.   calcium carbonate (TUMS - DOSED IN MG ELEMENTAL CALCIUM) 500 MG chewable tablet Chew 1 tablet by mouth 2 (two) times daily as needed for indigestion or heartburn.   cetirizine (ZYRTEC) 10 MG tablet Take 10 mg by mouth as needed for allergies.   Cholecalciferol (VITAMIN D) 125 MCG (5000 UT) CAPS Take 5,000 Units by mouth daily.    clopidogrel (PLAVIX) 75 MG tablet TAKE  1 TABLET BY MOUTH ONCE DAILY AT BEDTIME   Coenzyme Q10 (COQ-10) 100 MG CAPS Take 100 mg by mouth at bedtime.   dicyclomine (BENTYL) 10 MG capsule Take 1 capsule (10 mg total) by mouth as needed. (Patient taking differently: Take 10 mg by mouth as needed for spasms. )   esomeprazole (NEXIUM) 40 MG capsule Take 40 mg by mouth daily at 12 noon.   fluticasone (FLONASE) 50 MCG/ACT nasal spray Place 2 sprays into the nose at bedtime.    levothyroxine (SYNTHROID) 50 MCG tablet Take 50 mcg by mouth daily before breakfast.   meloxicam (MOBIC) 15 MG tablet Take 1 tablet by mouth daily as needed.   metoprolol succinate (TOPROL-XL) 50 MG 24 hr tablet Take 1 tablet (50 mg total) by mouth 2 (two) times daily. (Patient taking differently: Take 50 mg by mouth. Take three tablets ( 150 mg ) at bedtime)   Multiple Vitamins-Minerals (PRESERVISION AREDS 2) CAPS Take 1 capsule by mouth 2 (two) times daily.   nitroGLYCERIN (NITROSTAT) 0.4 MG SL tablet Place 0.4 mg under the tongue every 5 (five) minutes as needed for chest pain.   olmesartan (BENICAR) 40 MG tablet Take 40 mg by mouth.   Omega-3 Fatty Acids (FISH OIL) 1000 MG CAPS Take 2,000 mg by mouth.   OVER THE COUNTER MEDICATION Take 1 capsule by mouth daily. Saffron Extract Max 88.5 mg   Potassium 99 MG TABS Take 1 tablet by mouth daily.    rosuvastatin (CRESTOR) 5 MG tablet Take 5 mg by mouth daily.   [DISCONTINUED] aspirin EC 81 MG tablet Take 1 tablet (81 mg total) by mouth daily. (Patient taking differently: Take 325 mg by mouth at bedtime. )     Allergies:   Iohexol and Ranexa [ranolazine]   Social History   Socioeconomic History   Marital status: Widowed    Spouse name: Not on file   Number of children: Not on file   Years of education: Not on file   Highest education level: Not on file  Occupational History   Occupation: [employer]  Tobacco Use   Smoking status: Never Smoker   Smokeless tobacco: Never Used  Scientific laboratory technician Use: Never used  Substance and Sexual Activity   Alcohol use: No   Drug use: No   Sexual activity: Not on file  Other Topics Concern   Not on file  Social History Narrative   Not on file   Social Determinants of Health   Financial Resource Strain:    Difficulty of Paying Living Expenses: Not on file  Food Insecurity:    Worried About [place] in the Last Year: Not on file   Ran Out of Food in the Last Year: Not on file  Transportation Needs:    Lack of Transportation (Medical): Not on file   Lack of Transportation (Non-Medical): Not on file  Physical Activity:    Days of Exercise per Week: Not on file   Minutes of Exercise per Session: Not on file  Stress:    Feeling of Stress : Not on file  Social Connections:    Frequency of Communication with Friends and Family: Not on file   Frequency of Social Gatherings with Friends and Family: Not on file   Attends Religious Services: Not on file   Active Member of Clubs or Organizations: Not on file   Attends Archivist Meetings: Not on file   Marital Status: Not on file  Family History: The patient's family history includes Breast cancer in her sister; Colon cancer in her maternal aunt; Glaucoma in her brother and mother; Heart disease in her brother; Irritable bowel syndrome in  her mother; Macular degeneration in her paternal uncle. ROS:   Please see the history of present illness.    All other systems reviewed and are negative.  EKGs/Labs/Other Studies Reviewed:    The following studies were reviewed today:  EKG:  EKG ordered today and personally reviewed.  The ekg ordered today demonstrates sinus rhythm and normal  Recent Labs: 06/14/2019: BUN 8; Creatinine, Ser 0.68; Hemoglobin 11.5; Platelets 276; Potassium 3.6; Sodium 143  Recent Lipid Panel No results found for: CHOL, TRIG, HDL, CHOLHDL, VLDL, LDLCALC, LDLDIRECT  Physical Exam:    VS:  BP (!) 178/83    Pulse 76    Ht 5\' 3"  (1.6 m)    Wt 156 lb 12.8 oz (71.1 kg)    SpO2 96%    BMI 27.78 kg/m     Wt Readings from Last 3 Encounters:  05/07/20 156 lb 12.8 oz (71.1 kg)  09/25/19 153 lb (69.4 kg)  06/26/19 152 lb 3.2 oz (69 kg)     GEN:  Well nourished, well developed in no acute distress HEENT: Normal NECK: No JVD; No carotid bruits LYMPHATICS: No lymphadenopathy CARDIAC: RRR, no murmurs, rubs, gallops RESPIRATORY:  Clear to auscultation without rales, wheezing or rhonchi  ABDOMEN: Soft, non-tender, non-distended MUSCULOSKELETAL:  No edema; No deformity  SKIN: Warm and dry NEUROLOGIC:  Alert and oriented x 3 PSYCHIATRIC:  Normal affect    Signed, Shirlee More, MD  05/07/2020 1:59 PM    [facility]

## 2020-05-14 DIAGNOSIS — D352 Benign neoplasm of pituitary gland: Secondary | ICD-10-CM | POA: Diagnosis not present

## 2020-05-14 DIAGNOSIS — K861 Other chronic pancreatitis: Secondary | ICD-10-CM | POA: Diagnosis not present

## 2020-05-14 DIAGNOSIS — I10 Essential (primary) hypertension: Secondary | ICD-10-CM | POA: Diagnosis not present

## 2020-05-14 DIAGNOSIS — E785 Hyperlipidemia, unspecified: Secondary | ICD-10-CM | POA: Diagnosis not present

## 2020-05-20 DIAGNOSIS — R5383 Other fatigue: Secondary | ICD-10-CM | POA: Diagnosis not present

## 2020-05-20 DIAGNOSIS — R5381 Other malaise: Secondary | ICD-10-CM | POA: Diagnosis not present

## 2020-05-20 DIAGNOSIS — J309 Allergic rhinitis, unspecified: Secondary | ICD-10-CM | POA: Diagnosis not present

## 2020-05-20 DIAGNOSIS — E039 Hypothyroidism, unspecified: Secondary | ICD-10-CM | POA: Diagnosis not present

## 2020-05-20 DIAGNOSIS — F418 Other specified anxiety disorders: Secondary | ICD-10-CM | POA: Diagnosis not present

## 2020-05-20 DIAGNOSIS — Z6828 Body mass index (BMI) 28.0-28.9, adult: Secondary | ICD-10-CM | POA: Diagnosis not present

## 2020-05-20 DIAGNOSIS — D352 Benign neoplasm of pituitary gland: Secondary | ICD-10-CM | POA: Diagnosis not present

## 2020-05-20 DIAGNOSIS — I10 Essential (primary) hypertension: Secondary | ICD-10-CM | POA: Diagnosis not present

## 2020-05-21 DIAGNOSIS — Z8719 Personal history of other diseases of the digestive system: Secondary | ICD-10-CM | POA: Diagnosis not present

## 2020-05-21 DIAGNOSIS — N281 Cyst of kidney, acquired: Secondary | ICD-10-CM | POA: Diagnosis not present

## 2020-05-21 DIAGNOSIS — K760 Fatty (change of) liver, not elsewhere classified: Secondary | ICD-10-CM | POA: Diagnosis not present

## 2020-05-27 DIAGNOSIS — Z86010 Personal history of colonic polyps: Secondary | ICD-10-CM | POA: Diagnosis not present

## 2020-05-27 DIAGNOSIS — K862 Cyst of pancreas: Secondary | ICD-10-CM | POA: Diagnosis not present

## 2020-06-04 DIAGNOSIS — Z20822 Contact with and (suspected) exposure to covid-19: Secondary | ICD-10-CM | POA: Diagnosis not present

## 2020-06-04 DIAGNOSIS — M159 Polyosteoarthritis, unspecified: Secondary | ICD-10-CM | POA: Diagnosis not present

## 2020-06-04 DIAGNOSIS — N390 Urinary tract infection, site not specified: Secondary | ICD-10-CM | POA: Diagnosis not present

## 2020-06-04 DIAGNOSIS — K2100 Gastro-esophageal reflux disease with esophagitis, without bleeding: Secondary | ICD-10-CM | POA: Diagnosis not present

## 2020-06-04 DIAGNOSIS — D352 Benign neoplasm of pituitary gland: Secondary | ICD-10-CM | POA: Diagnosis not present

## 2020-06-04 DIAGNOSIS — Z6827 Body mass index (BMI) 27.0-27.9, adult: Secondary | ICD-10-CM | POA: Diagnosis not present

## 2020-06-04 DIAGNOSIS — F418 Other specified anxiety disorders: Secondary | ICD-10-CM | POA: Diagnosis not present

## 2020-06-04 DIAGNOSIS — M797 Fibromyalgia: Secondary | ICD-10-CM | POA: Diagnosis not present

## 2020-06-04 DIAGNOSIS — I10 Essential (primary) hypertension: Secondary | ICD-10-CM | POA: Diagnosis not present

## 2020-06-04 DIAGNOSIS — I2510 Atherosclerotic heart disease of native coronary artery without angina pectoris: Secondary | ICD-10-CM | POA: Diagnosis not present

## 2020-06-04 DIAGNOSIS — E785 Hyperlipidemia, unspecified: Secondary | ICD-10-CM | POA: Diagnosis not present

## 2020-06-04 DIAGNOSIS — K861 Other chronic pancreatitis: Secondary | ICD-10-CM | POA: Diagnosis not present

## 2020-08-05 ENCOUNTER — Other Ambulatory Visit: Payer: Self-pay | Admitting: Cardiology

## 2020-09-13 ENCOUNTER — Telehealth: Payer: Self-pay

## 2020-09-13 NOTE — Telephone Encounter (Signed)
   [facility] Pre-operative Risk Assessment    HEARTCARE STAFF: - Please ensure there is not already an duplicate clearance open for this procedure. - Under Visit Info/Reason for Call, type in Other and utilize the format Clearance MM/DD/YY or Clearance TBD. Do not use dashes or single digits. - If request is for dental extraction, please clarify the # of teeth to be extracted.  Request for surgical clearance:  1. What type of surgery is being performed? Colonoscopy   2. When is this surgery scheduled? TBD   3. What type of clearance is required (medical clearance vs. Pharmacy clearance to hold med vs. Both)? Both  4. Are there any medications that need to be held prior to surgery and how long? None specified   5. Practice name and name of physician performing surgery? [facility]  6. What is the office phone number? [phone]   7.   What is the office fax number? [phone]  8.   Anesthesia type (None, local, MAC, general) ? General   Pamela Hogan 09/13/2020, 2:08 PM  _________________________________________________________________   (provider comments below)

## 2020-09-16 NOTE — Telephone Encounter (Signed)
   Primary Cardiologist: Shirlee More, MD  Chart reviewed as part of pre-operative protocol coverage. At last OV, blood pressure was poorly controlled so may need appt when called.  Will route to callback team to inquire about need to hold meds. Not listed to hold any which is unusual. Patient listed to be on Plavix and Dr. Joya Gaskins note also mentioned continuing 81mg  ASA (in place of 325mg ) as well.   Charlie Pitter, PA-C  09/16/2020, 11:49 AM

## 2020-09-17 NOTE — Telephone Encounter (Signed)
Called the requesting office and left a message for Latoya asking if their office is wanting the patient to hold her medications-ASA and Plavix to give our office a call and ask for the pre-op callback pool.

## 2020-09-18 NOTE — Telephone Encounter (Signed)
Tried to call requesting provider's office, office is closed. Hours on fridays 8-1

## 2020-09-19 NOTE — Telephone Encounter (Signed)
I tried to reach requesting office today to confirm if Plavix and ASA are needing to be held, as it was not requested on clearance request. I tried to leave a message though recording came on and stating being transferred then call was disconnected. Not sure if message was truly left.

## 2020-09-19 NOTE — Telephone Encounter (Signed)
Phineas Real is returning call. She states the patient needs to hold Plavix and Aspirin and they would like our advisement to determine the hold time.  Phone #: [phone] (ext: [phone])

## 2020-09-20 NOTE — Telephone Encounter (Signed)
Dr. Bettina Gavia to review.  Although during the previous visit, Dr. Bettina Gavia recommended switching the patient from 325 mg high-dose aspirin to 81 mg low-dose aspirin.  Patient says she is still taking the 325 mg high-dose aspirin to this day.  She says she checked with both her PCP and her neurologist, they suspected she had a TIA, therefore urged her to be on the high-dose 325 mg aspirin in combination with Plavix.  Otherwise she denies any chest pain or shortness of breath.  Colonoscopy is a very low risk procedure.  Dr. Bettina Gavia, would you be okay with the patient to hold both aspirin and Plavix for 5 days prior to her colonoscopy procedure?  Please forward your response to [facility] PREOP

## 2020-09-22 NOTE — Telephone Encounter (Signed)
Yes her CAD is stable and she can interrupt dual antiplatelet therapy for colonoscopy, like to see her start back on aspirin within 24 to 48 hours and clopidogrel as soon as safe from a bleeding perspective depending on whether or not she has biopsy.

## 2020-09-23 NOTE — Telephone Encounter (Signed)
**Note De-identified Pamela Hogan Obfuscation**   **Note De-Identified Neizan Debruhl Obfuscation** Primary Cardiologist: Shirlee More, MD  Chart reviewed as part of pre-operative protocol coverage. Given past medical history and time since last visit, based on ACC/AHA guidelines, RETHEL SEBEK would be at acceptable risk for the planned procedure without further cardiovascular testing.   Per Dr. Bettina Gavia, her CAD is stable and she can interrupt dual antiplatelet therapy for colonoscopy, however he would like her to be restarted on aspirin within 24 to 48 hours and clopidogrel as soon as safe from a bleeding perspective depending on whether or not she has biopsy.  The patient was advised that if she develops new symptoms prior to surgery to contact our office to arrange for a follow-up visit, and she verbalized understanding.  I will route this recommendation to the requesting party [facility] Epic fax function and remove from pre-op pool.  Please call with questions.  Kathyrn Drown, NP 09/23/2020, 10:26 AM

## 2020-11-13 ENCOUNTER — Ambulatory Visit: Payer: Medicare HMO | Admitting: Sports Medicine

## 2020-11-14 ENCOUNTER — Encounter: Payer: Self-pay | Admitting: Podiatry

## 2020-11-14 ENCOUNTER — Ambulatory Visit (INDEPENDENT_AMBULATORY_CARE_PROVIDER_SITE_OTHER): Payer: Medicare HMO

## 2020-11-14 ENCOUNTER — Other Ambulatory Visit: Payer: Self-pay

## 2020-11-14 ENCOUNTER — Ambulatory Visit: Payer: Medicare HMO | Admitting: Podiatry

## 2020-11-14 DIAGNOSIS — M2041 Other hammer toe(s) (acquired), right foot: Secondary | ICD-10-CM | POA: Diagnosis not present

## 2020-11-14 DIAGNOSIS — D689 Coagulation defect, unspecified: Secondary | ICD-10-CM | POA: Diagnosis not present

## 2020-11-14 DIAGNOSIS — M2011 Hallux valgus (acquired), right foot: Secondary | ICD-10-CM

## 2020-11-14 DIAGNOSIS — M2012 Hallux valgus (acquired), left foot: Secondary | ICD-10-CM

## 2020-11-14 DIAGNOSIS — B351 Tinea unguium: Secondary | ICD-10-CM | POA: Diagnosis not present

## 2020-11-14 NOTE — Progress Notes (Signed)
  Subjective:  Patient ID: Pamela Hogan, female    DOB: [birth year],  MRN: [id number]  Chief Complaint  Patient presents with  . Nail Problem    Trim nails   86 y.o. female presents with the above complaint. History confirmed with patient. Also complains of 2nd toe hammertoes which cause her pain especially with certain shoes.  On Plavix.  Objective:  Physical Exam: warm, good capillary refill, no trophic changes or ulcerative lesions, normal DP and PT pulses and normal sensory exam. Hammertoes bilateral 2nd toes. Nails thickened and elongated. HAV deformity bilat.  No images are attached to the encounter.  Radiographs: X-ray of the right foot: no fracture, dislocation, swelling or degenerative changes noted and digital contractures Assessment:   1. Hammer toe of right foot    Plan:  Patient was evaluated and treated and all questions answered.  Hammertoe -XR reviewed with patient -Educated on etiology of deformity -Discussed padding and shoe gear changes  Onychomycosis, Coagulation Defect -Nails debrided x10  Procedure: Nail Debridement Type of Debridement: manual, sharp debridement. Instrumentation: Nail nipper, rotary burr. Number of Nails: 10  No follow-ups on file.

## 2020-11-20 DIAGNOSIS — R5381 Other malaise: Secondary | ICD-10-CM | POA: Insufficient documentation

## 2020-11-20 DIAGNOSIS — Z955 Presence of coronary angioplasty implant and graft: Secondary | ICD-10-CM | POA: Insufficient documentation

## 2020-12-24 ENCOUNTER — Ambulatory Visit: Payer: Medicare HMO | Admitting: Cardiology

## 2021-02-10 DIAGNOSIS — I358 Other nonrheumatic aortic valve disorders: Secondary | ICD-10-CM | POA: Insufficient documentation

## 2021-02-10 DIAGNOSIS — I340 Nonrheumatic mitral (valve) insufficiency: Secondary | ICD-10-CM | POA: Insufficient documentation

## 2021-02-17 ENCOUNTER — Ambulatory Visit (INDEPENDENT_AMBULATORY_CARE_PROVIDER_SITE_OTHER): Payer: Medicare HMO | Admitting: Podiatry

## 2021-02-17 DIAGNOSIS — Z5329 Procedure and treatment not carried out because of patient's decision for other reasons: Secondary | ICD-10-CM

## 2021-02-27 DEATH — deceased
